# Patient Record
Sex: Female | Born: 1970 | Race: Black or African American | Hispanic: No | Marital: Single | Smoking: Never smoker
Health system: Southern US, Community
[De-identification: ages and names within clinical notes are randomized; demographics above are authoritative.]

## PROBLEM LIST (undated history)

## (undated) DIAGNOSIS — D649 Anemia, unspecified: Secondary | ICD-10-CM

## (undated) DIAGNOSIS — J45909 Unspecified asthma, uncomplicated: Secondary | ICD-10-CM

## (undated) DIAGNOSIS — M549 Dorsalgia, unspecified: Secondary | ICD-10-CM

## (undated) HISTORY — PX: FOOT SURGERY: SHX648

---

## 1997-11-12 ENCOUNTER — Emergency Department (HOSPITAL_COMMUNITY): Admission: EM | Admit: 1997-11-12 | Discharge: 1997-11-12 | Payer: Self-pay | Admitting: Emergency Medicine

## 1997-11-15 ENCOUNTER — Ambulatory Visit (HOSPITAL_COMMUNITY): Admission: RE | Admit: 1997-11-15 | Discharge: 1997-11-15 | Payer: Self-pay

## 1997-11-16 ENCOUNTER — Emergency Department (HOSPITAL_COMMUNITY): Admission: EM | Admit: 1997-11-16 | Discharge: 1997-11-16 | Payer: Self-pay | Admitting: Emergency Medicine

## 1997-12-06 ENCOUNTER — Other Ambulatory Visit: Admission: RE | Admit: 1997-12-06 | Discharge: 1997-12-06 | Payer: Self-pay | Admitting: Obstetrics

## 1998-02-20 ENCOUNTER — Emergency Department (HOSPITAL_COMMUNITY): Admission: EM | Admit: 1998-02-20 | Discharge: 1998-02-20 | Payer: Self-pay | Admitting: Emergency Medicine

## 1998-03-17 ENCOUNTER — Ambulatory Visit (HOSPITAL_COMMUNITY): Admission: RE | Admit: 1998-03-17 | Discharge: 1998-03-17 | Payer: Self-pay | Admitting: *Deleted

## 1998-03-17 ENCOUNTER — Encounter: Payer: Self-pay | Admitting: *Deleted

## 1998-03-31 ENCOUNTER — Ambulatory Visit (HOSPITAL_COMMUNITY): Admission: RE | Admit: 1998-03-31 | Discharge: 1998-03-31 | Payer: Self-pay | Admitting: *Deleted

## 1998-05-12 ENCOUNTER — Emergency Department (HOSPITAL_COMMUNITY): Admission: EM | Admit: 1998-05-12 | Discharge: 1998-05-12 | Payer: Self-pay | Admitting: Emergency Medicine

## 1998-07-06 ENCOUNTER — Encounter: Admission: RE | Admit: 1998-07-06 | Discharge: 1998-07-06 | Payer: Self-pay | Admitting: Family Medicine

## 1998-07-24 ENCOUNTER — Emergency Department (HOSPITAL_COMMUNITY): Admission: EM | Admit: 1998-07-24 | Discharge: 1998-07-24 | Payer: Self-pay | Admitting: Emergency Medicine

## 1998-10-26 ENCOUNTER — Encounter: Admission: RE | Admit: 1998-10-26 | Discharge: 1998-10-26 | Payer: Self-pay | Admitting: Family Medicine

## 1998-10-31 ENCOUNTER — Encounter: Admission: RE | Admit: 1998-10-31 | Discharge: 1998-12-09 | Payer: Self-pay | Admitting: *Deleted

## 1999-01-25 ENCOUNTER — Emergency Department (HOSPITAL_COMMUNITY): Admission: EM | Admit: 1999-01-25 | Discharge: 1999-01-25 | Payer: Self-pay | Admitting: Emergency Medicine

## 1999-02-03 ENCOUNTER — Encounter: Admission: RE | Admit: 1999-02-03 | Discharge: 1999-02-03 | Payer: Self-pay | Admitting: Family Medicine

## 2003-01-11 ENCOUNTER — Emergency Department (HOSPITAL_COMMUNITY): Admission: EM | Admit: 2003-01-11 | Discharge: 2003-01-11 | Payer: Self-pay | Admitting: Emergency Medicine

## 2003-01-20 ENCOUNTER — Emergency Department (HOSPITAL_COMMUNITY): Admission: EM | Admit: 2003-01-20 | Discharge: 2003-01-20 | Payer: Self-pay | Admitting: Emergency Medicine

## 2003-05-15 ENCOUNTER — Emergency Department (HOSPITAL_COMMUNITY): Admission: EM | Admit: 2003-05-15 | Discharge: 2003-05-15 | Payer: Self-pay | Admitting: Emergency Medicine

## 2005-01-09 ENCOUNTER — Ambulatory Visit (HOSPITAL_COMMUNITY): Payer: Self-pay | Admitting: Psychiatry

## 2005-06-19 ENCOUNTER — Emergency Department (HOSPITAL_COMMUNITY): Admission: EM | Admit: 2005-06-19 | Discharge: 2005-06-19 | Payer: Self-pay | Admitting: Family Medicine

## 2005-07-17 ENCOUNTER — Emergency Department (HOSPITAL_COMMUNITY): Admission: EM | Admit: 2005-07-17 | Discharge: 2005-07-17 | Payer: Self-pay | Admitting: Emergency Medicine

## 2005-10-24 ENCOUNTER — Encounter: Payer: Self-pay | Admitting: *Deleted

## 2006-05-02 ENCOUNTER — Emergency Department (HOSPITAL_COMMUNITY): Admission: EM | Admit: 2006-05-02 | Discharge: 2006-05-02 | Payer: Self-pay | Admitting: Family Medicine

## 2006-12-02 ENCOUNTER — Emergency Department (HOSPITAL_COMMUNITY): Admission: EM | Admit: 2006-12-02 | Discharge: 2006-12-02 | Payer: Self-pay | Admitting: Emergency Medicine

## 2008-08-04 ENCOUNTER — Emergency Department (HOSPITAL_COMMUNITY): Admission: EM | Admit: 2008-08-04 | Discharge: 2008-08-04 | Payer: Self-pay | Admitting: Emergency Medicine

## 2008-08-31 ENCOUNTER — Emergency Department (HOSPITAL_COMMUNITY): Admission: EM | Admit: 2008-08-31 | Discharge: 2008-08-31 | Payer: Self-pay | Admitting: Emergency Medicine

## 2009-07-10 ENCOUNTER — Emergency Department (HOSPITAL_COMMUNITY): Admission: EM | Admit: 2009-07-10 | Discharge: 2009-07-10 | Payer: Self-pay | Admitting: Emergency Medicine

## 2010-02-15 IMAGING — CR DG ABDOMEN ACUTE W/ 1V CHEST
3 series · 3 of 3 positions shown · non-contrast
Comparison: Chest and acute abdomen of 12/02/2006

CLINICAL DATA: Short of breath, abdominal pain, nausea

ACUTE ABDOMEN SERIES (ABDOMEN 2 VIEW & CHEST 1 VIEW)

[w chest pa]
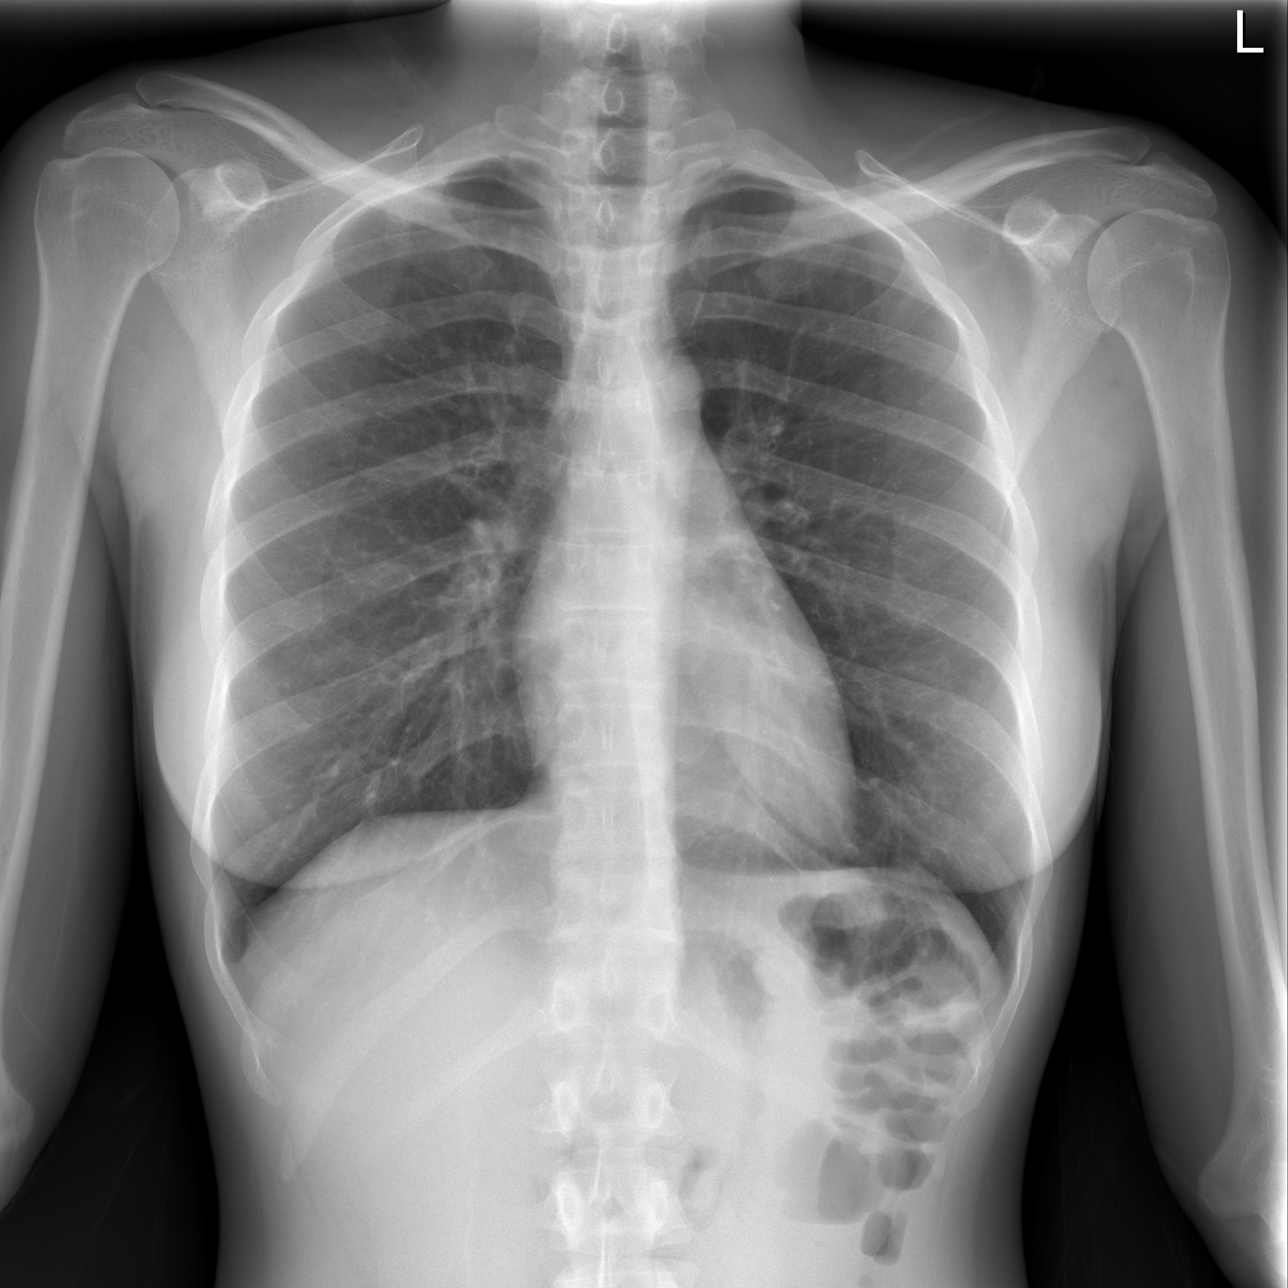

[w abdomen upright *]
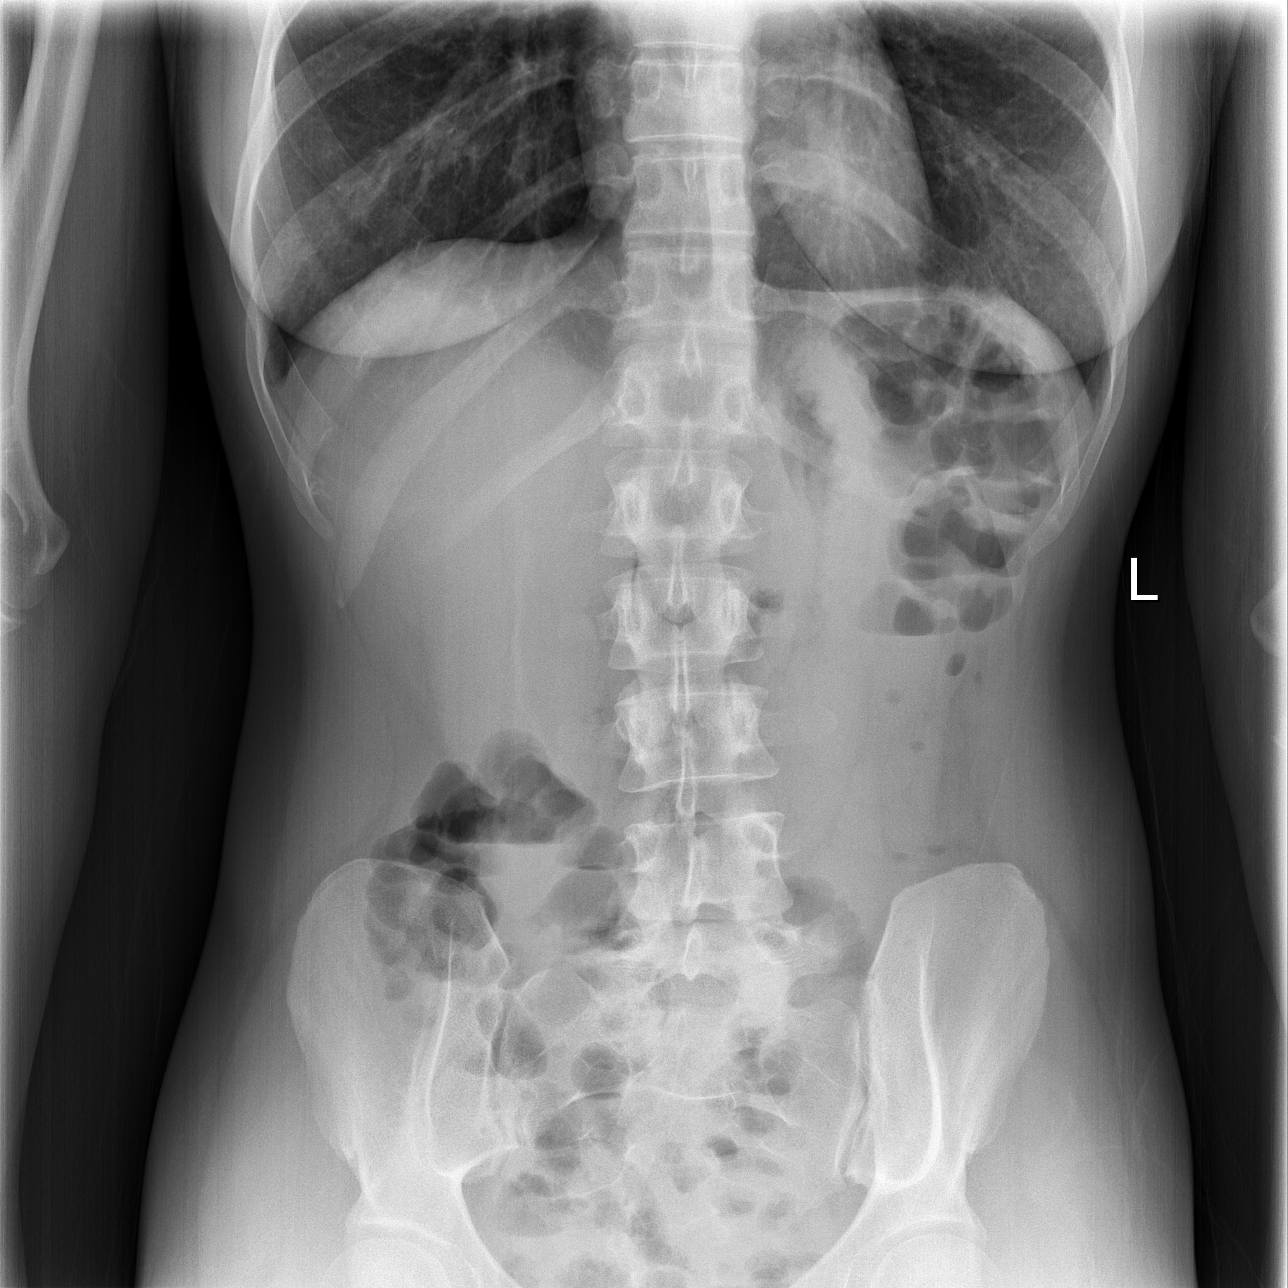

[t abdomen supine]
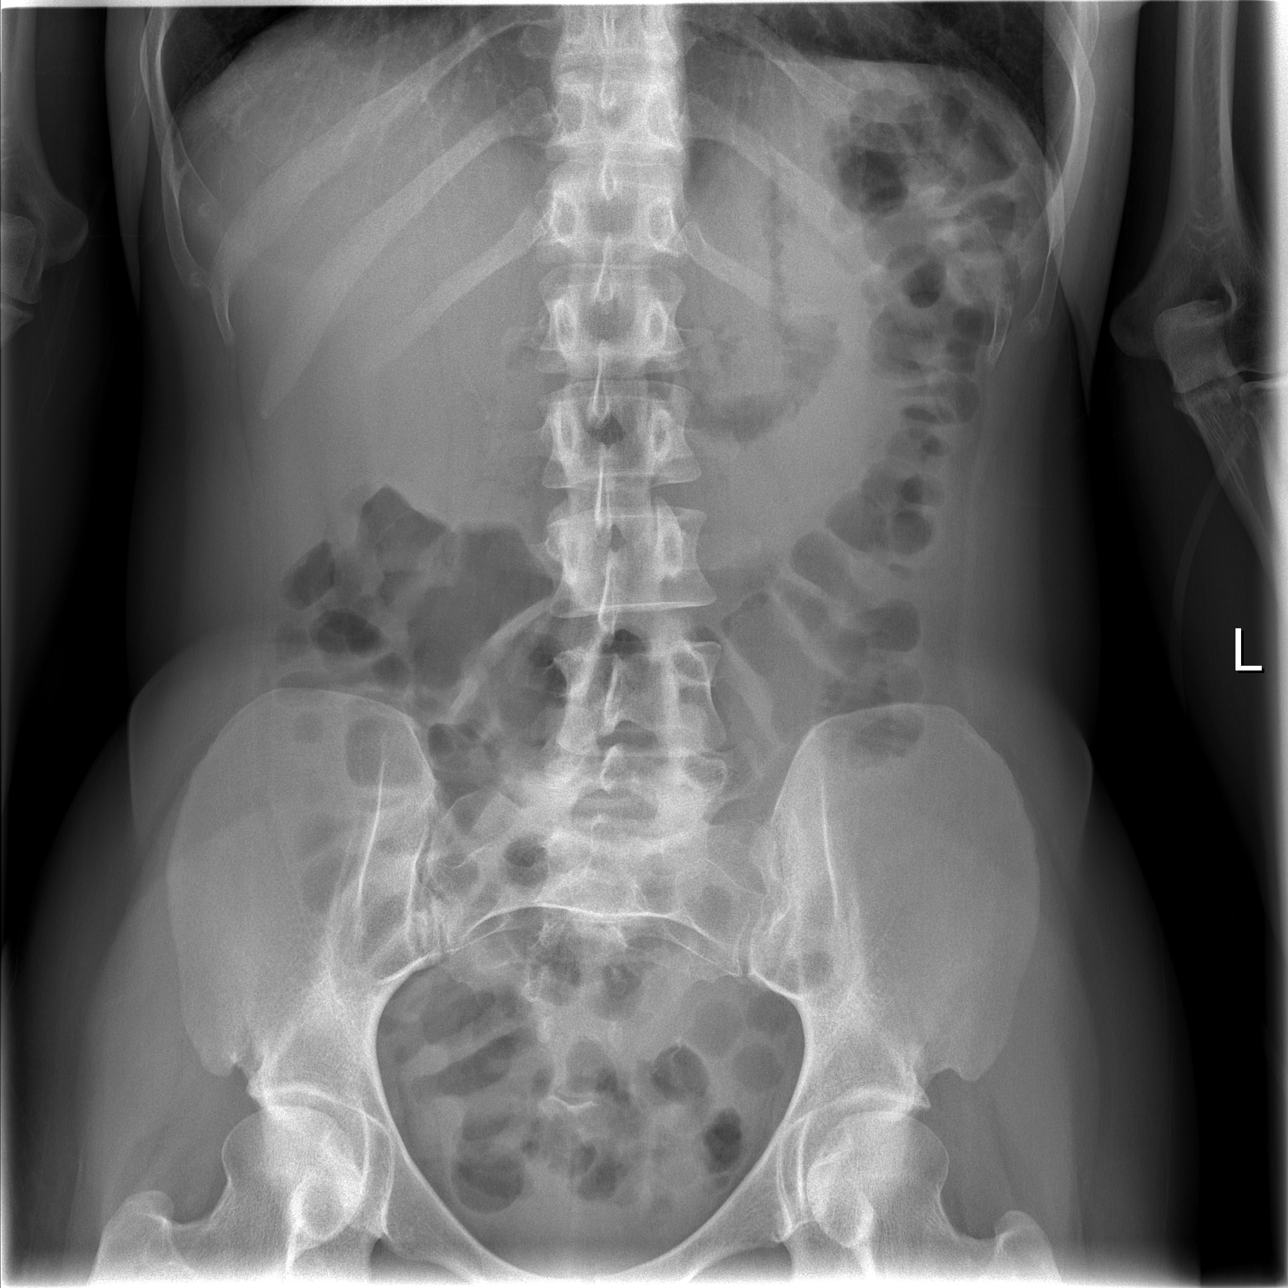

[3 of 3 positions shown; findings below may reference images not displayed]

FINDINGS: The lungs are clear. The heart is within normal limits in
size. No bony abnormality is seen.

Supine and erect views of the abdomen show no bowel obstruction and
no free air.  No opaque calculi are seen.  On the supine film there
is some separation of the gastric air shadow and the transverse
colon, a finding of questionable significance.  This can be seen
with ascites or possibly pancreatitis.  Clinical correlation is
recommended.
IMPRESSION: 1.  No active lung disease.
2.  No obstruction or free air.
3.  Some separation of the gastric air shadow and the transverse
colon on the supine film of questionable significance.  Cannot
exclude ascites or possibly pancreatitis as noted above.  Correlate
clinically.

## 2010-10-05 LAB — PREGNANCY, URINE: Preg Test, Ur: NEGATIVE

## 2010-10-05 LAB — DIFFERENTIAL
Eosinophils Absolute: 0.1 10*3/uL (ref 0.0–0.7)
Lymphocytes Relative: 13 % (ref 12–46)
Lymphs Abs: 0.9 10*3/uL (ref 0.7–4.0)
Neutro Abs: 5.3 10*3/uL (ref 1.7–7.7)
Neutrophils Relative %: 81 % — ABNORMAL HIGH (ref 43–77)

## 2010-10-05 LAB — URINALYSIS, ROUTINE W REFLEX MICROSCOPIC
Bilirubin Urine: NEGATIVE
Glucose, UA: NEGATIVE mg/dL
Hgb urine dipstick: NEGATIVE
Ketones, ur: NEGATIVE mg/dL
Nitrite: NEGATIVE
Protein, ur: NEGATIVE mg/dL
Specific Gravity, Urine: 1.024 (ref 1.005–1.030)
Urobilinogen, UA: 1 mg/dL (ref 0.0–1.0)
pH: 5.5 (ref 5.0–8.0)

## 2010-10-05 LAB — COMPREHENSIVE METABOLIC PANEL
ALT: 11 U/L (ref 0–35)
BUN: 7 mg/dL (ref 6–23)
CO2: 21 mEq/L (ref 19–32)
Calcium: 9.3 mg/dL (ref 8.4–10.5)
Creatinine, Ser: 0.82 mg/dL (ref 0.4–1.2)
GFR calc non Af Amer: >60 mL/min (ref >60)
Glucose, Bld: 94 mg/dL (ref 70–99)
Total Protein: 8.2 g/dL (ref 6.0–8.3)

## 2010-10-05 LAB — CBC
HCT: 35.9 % — ABNORMAL LOW (ref 36.0–46.0)
Hemoglobin: 12.1 g/dL (ref 12.0–15.0)
MCHC: 33.8 g/dL (ref 30.0–36.0)
MCV: 90.2 fL (ref 78.0–100.0)
RBC: 3.98 MIL/uL (ref 3.87–5.11)
RDW: 16.1 % — ABNORMAL HIGH (ref 11.5–15.5)

## 2011-04-12 LAB — CBC
HCT: 35.1 — ABNORMAL LOW
Hemoglobin: 11.8 — ABNORMAL LOW
MCHC: 33.7
RDW: 15.1 — ABNORMAL HIGH

## 2011-04-12 LAB — COMPREHENSIVE METABOLIC PANEL
BUN: 21
Calcium: 9.9
Glucose, Bld: 104 — ABNORMAL HIGH
Total Protein: 8.1

## 2011-04-12 LAB — DIFFERENTIAL
Lymphocytes Relative: 26
Lymphs Abs: 1.3
Monocytes Relative: 5
Neutro Abs: 3.3
Neutrophils Relative %: 68

## 2011-04-12 LAB — URINE MICROSCOPIC-ADD ON

## 2011-04-12 LAB — URINALYSIS, ROUTINE W REFLEX MICROSCOPIC
Glucose, UA: NEGATIVE
Leukocytes, UA: NEGATIVE
Protein, ur: 100 — AB
Specific Gravity, Urine: 1.039 — ABNORMAL HIGH
Urobilinogen, UA: 0.2

## 2011-12-25 ENCOUNTER — Emergency Department (HOSPITAL_COMMUNITY)
Admission: EM | Admit: 2011-12-25 | Discharge: 2011-12-25 | Disposition: A | Discharge status: Home / Self Care | Payer: Self-pay | Attending: Emergency Medicine | Admitting: Emergency Medicine

## 2011-12-25 ENCOUNTER — Encounter (HOSPITAL_COMMUNITY): Payer: Self-pay | Admitting: *Deleted

## 2011-12-25 ENCOUNTER — Emergency Department (HOSPITAL_COMMUNITY): Payer: Self-pay

## 2011-12-25 DIAGNOSIS — R059 Cough, unspecified: Secondary | ICD-10-CM | POA: Insufficient documentation

## 2011-12-25 DIAGNOSIS — R05 Cough: Secondary | ICD-10-CM | POA: Insufficient documentation

## 2011-12-25 DIAGNOSIS — J45909 Unspecified asthma, uncomplicated: Secondary | ICD-10-CM | POA: Insufficient documentation

## 2011-12-25 DIAGNOSIS — F17200 Nicotine dependence, unspecified, uncomplicated: Secondary | ICD-10-CM | POA: Insufficient documentation

## 2011-12-25 DIAGNOSIS — Z79899 Other long term (current) drug therapy: Secondary | ICD-10-CM | POA: Insufficient documentation

## 2011-12-25 HISTORY — DX: Unspecified asthma, uncomplicated: J45.909

## 2011-12-25 MED ORDER — PREDNISONE 20 MG PO TABS: 60.0000 mg | ORAL_TABLET | Freq: Every day | ORAL | Status: DC

## 2011-12-25 MED ORDER — ALBUTEROL SULFATE (5 MG/ML) 0.5% IN NEBU
2.5000 mg | INHALATION_SOLUTION | RESPIRATORY_TRACT | Status: DC
  Administered 2011-12-25: 2.5 mg via RESPIRATORY_TRACT
  Filled 2011-12-25: qty 0.5

## 2011-12-25 MED ORDER — ALBUTEROL SULFATE HFA 108 (90 BASE) MCG/ACT IN AERS
2.0000 | INHALATION_SPRAY | RESPIRATORY_TRACT | Status: DC | PRN
  Administered 2011-12-25: 2 via RESPIRATORY_TRACT
  Filled 2011-12-25: qty 6.7

## 2011-12-25 MED ORDER — IPRATROPIUM BROMIDE 0.02 % IN SOLN
0.5000 mg | RESPIRATORY_TRACT | Status: DC
  Administered 2011-12-25: 0.5 mg via RESPIRATORY_TRACT
  Filled 2011-12-25: qty 2.5

## 2011-12-25 MED ORDER — ACETAMINOPHEN-CODEINE #3 300-30 MG PO TABS: 1.0000 | ORAL_TABLET | Freq: Four times a day (QID) | ORAL | Status: AC | PRN

## 2011-12-25 MED ORDER — PREDNISONE 20 MG PO TABS
60.0000 mg | ORAL_TABLET | Freq: Once | ORAL | Status: AC
  Administered 2011-12-25: 60 mg via ORAL
  Filled 2011-12-25: qty 3

## 2011-12-25 MED ORDER — ALBUTEROL SULFATE 2 MG PO TABS: 2.0000 mg | ORAL_TABLET | Freq: Three times a day (TID) | ORAL | Status: DC

## 2011-12-25 NOTE — ED Notes (Signed)
Pt states she has had a dry cough pt states she is unable to sleep at night for cough. Pt states she thinks her asthma flaring up. Pt states she has tried her inhalers but nothing is effective

## 2011-12-25 NOTE — ED Provider Notes (Signed)
History     CSN: [id number]  Arrival date & time 12/25/11  1056   First MD Initiated Contact with Patient 12/25/11 1131      Chief Complaint  Patient presents with  . Asthma  . Cough    (Consider location/radiation/quality/duration/timing/severity/associated sxs/prior treatment) HPI  H/o asthma pw cough, shortness of breath, wheezing. +Subj fever +Chills. C/O non productive cough. Denies rhinorrhea, nasal congestion. No sick contacts. Has been using albuterol 2 times per day. Has been compliant with advair. Denies h/o VTE in self or family. No recent hosp/surg/immob. No h/o cancer. Denies exogenous hormone use, no leg pain or swelling.  Her triggers include Rain/ weather. She is in everyday smoker  ED Notes, ED Provider Notes from 12/25/11 0000 to 12/25/11 11:26:57       Norina Buzzard, RN 12/25/2011 11:25      Pt states she has had a dry cough pt states she is unable to sleep at night for cough. Pt states she thinks her asthma flaring up. Pt states she has tried her inhalers but nothing is effective    Past Medical History  Diagnosis Date  . Asthma     No past surgical history on file.  No family history on file.  History  Substance Use Topics  . Smoking status: Current Everyday Smoker  . Smokeless tobacco: Not on file  . Alcohol Use: No    OB History    Grav Para Term Preterm Abortions TAB SAB Ect Mult Living                 Review of Systems  All other systems reviewed and are negative.  except as noted HPI  Allergies  Shellfish allergy  Home Medications   Current Outpatient Rx  Name Route Sig Dispense Refill  . ALBUTEROL SULFATE HFA 108 (90 BASE) MCG/ACT IN AERS Inhalation Inhale 2 puffs into the lungs every 6 (six) hours as needed. Wheezing and shortness of breath    . FLUTICASONE-SALMETEROL 250-50 MCG/DOSE IN AEPB Inhalation Inhale 1 puff into the lungs every 12 (twelve) hours.    . ACETAMINOPHEN-CODEINE #3 300-30 MG PO TABS Oral Take 1-2 tablets by  mouth every 6 (six) hours as needed for pain (cough). 15 tablet 0  . ALBUTEROL SULFATE 2 MG PO TABS Oral Take 1 tablet (2 mg total) by mouth 3 (three) times daily. 30 tablet 0  . PREDNISONE 20 MG PO TABS Oral Take 3 tablets (60 mg total) by mouth daily. 15 tablet 0    BP 109/71  Pulse 83  Temp 99 F (37.2 C)  Resp 20  Ht 5\' 5"  (1.651 m)  Wt 160 lb (72.576 kg)  BMI 26.63 kg/m2  SpO2 100%  LMP 12/11/2011  Physical Exam  Nursing note and vitals reviewed. Constitutional: She is oriented to person, place, and time. She appears well-developed.  HENT:  Head: Atraumatic.  Mouth/Throat: Oropharynx is clear and moist.  Eyes: Conjunctivae and EOM are normal. Pupils are equal, round, and reactive to light.  Neck: Normal range of motion. Neck supple.  Cardiovascular: Normal rate, regular rhythm, normal heart sounds and intact distal pulses.   Pulmonary/Chest: Effort normal. No respiratory distress. She has wheezes. She has no rales.       Diffuse expiratory wheeze, good air movement, no conversational dyspnea  Abdominal: Soft. She exhibits no distension. There is no tenderness. There is no rebound and no guarding.  Musculoskeletal: Normal range of motion.  Neurological: She is alert and oriented to  person, place, and time.  Skin: Skin is warm and dry. No rash noted.  Psychiatric: She has a normal mood and affect.    ED Course  Procedures (including critical care time)  Labs Reviewed - No data to display Dg Chest 2 View  12/25/2011  *RADIOLOGY REPORT*  Clinical Data: Asthma and cough.  CHEST - 2 VIEW  Comparison: Chest radiograph 07/10/2009  Findings: The heart, mediastinal, and hilar contours are normal. The trachea is midline.  The lungs are normally expanded and clear. No airspace disease, pneumothorax, or pneumomediastinum.  The bony thorax and visualized upper abdomen are unremarkable.  IMPRESSION: Stable exam.  No acute cardiopulmonary disease.  Original Report Authenticated By: Britta Mccreedy, M.D.   1. Asthma    MDM   History of asthma presents with acute asthma exacerbation. Chest x-ray without infiltrate. Patient is self pay and it is difficult for her to get her albuterol. She does not have a primary care doctor. I will prescribe albuterol tablets, inhaler, prednisone. Tylenol with Codeine to help with coughing. Given referrals for primary care doctor.       Forbes Cellar, MD 12/25/11 1316

## 2011-12-25 NOTE — ED Notes (Signed)
[id number]<AV> Expected date:<BR> Expected time:<BR> Means of arrival:<BR> Comments:<BR> clean

## 2013-06-10 IMAGING — CR DG CHEST 2V
2 series · 2 of 2 positions shown · non-contrast
Comparison: Chest radiograph 07/10/2009

CLINICAL DATA: Asthma and cough.

CHEST - 2 VIEW

[w chest pa]
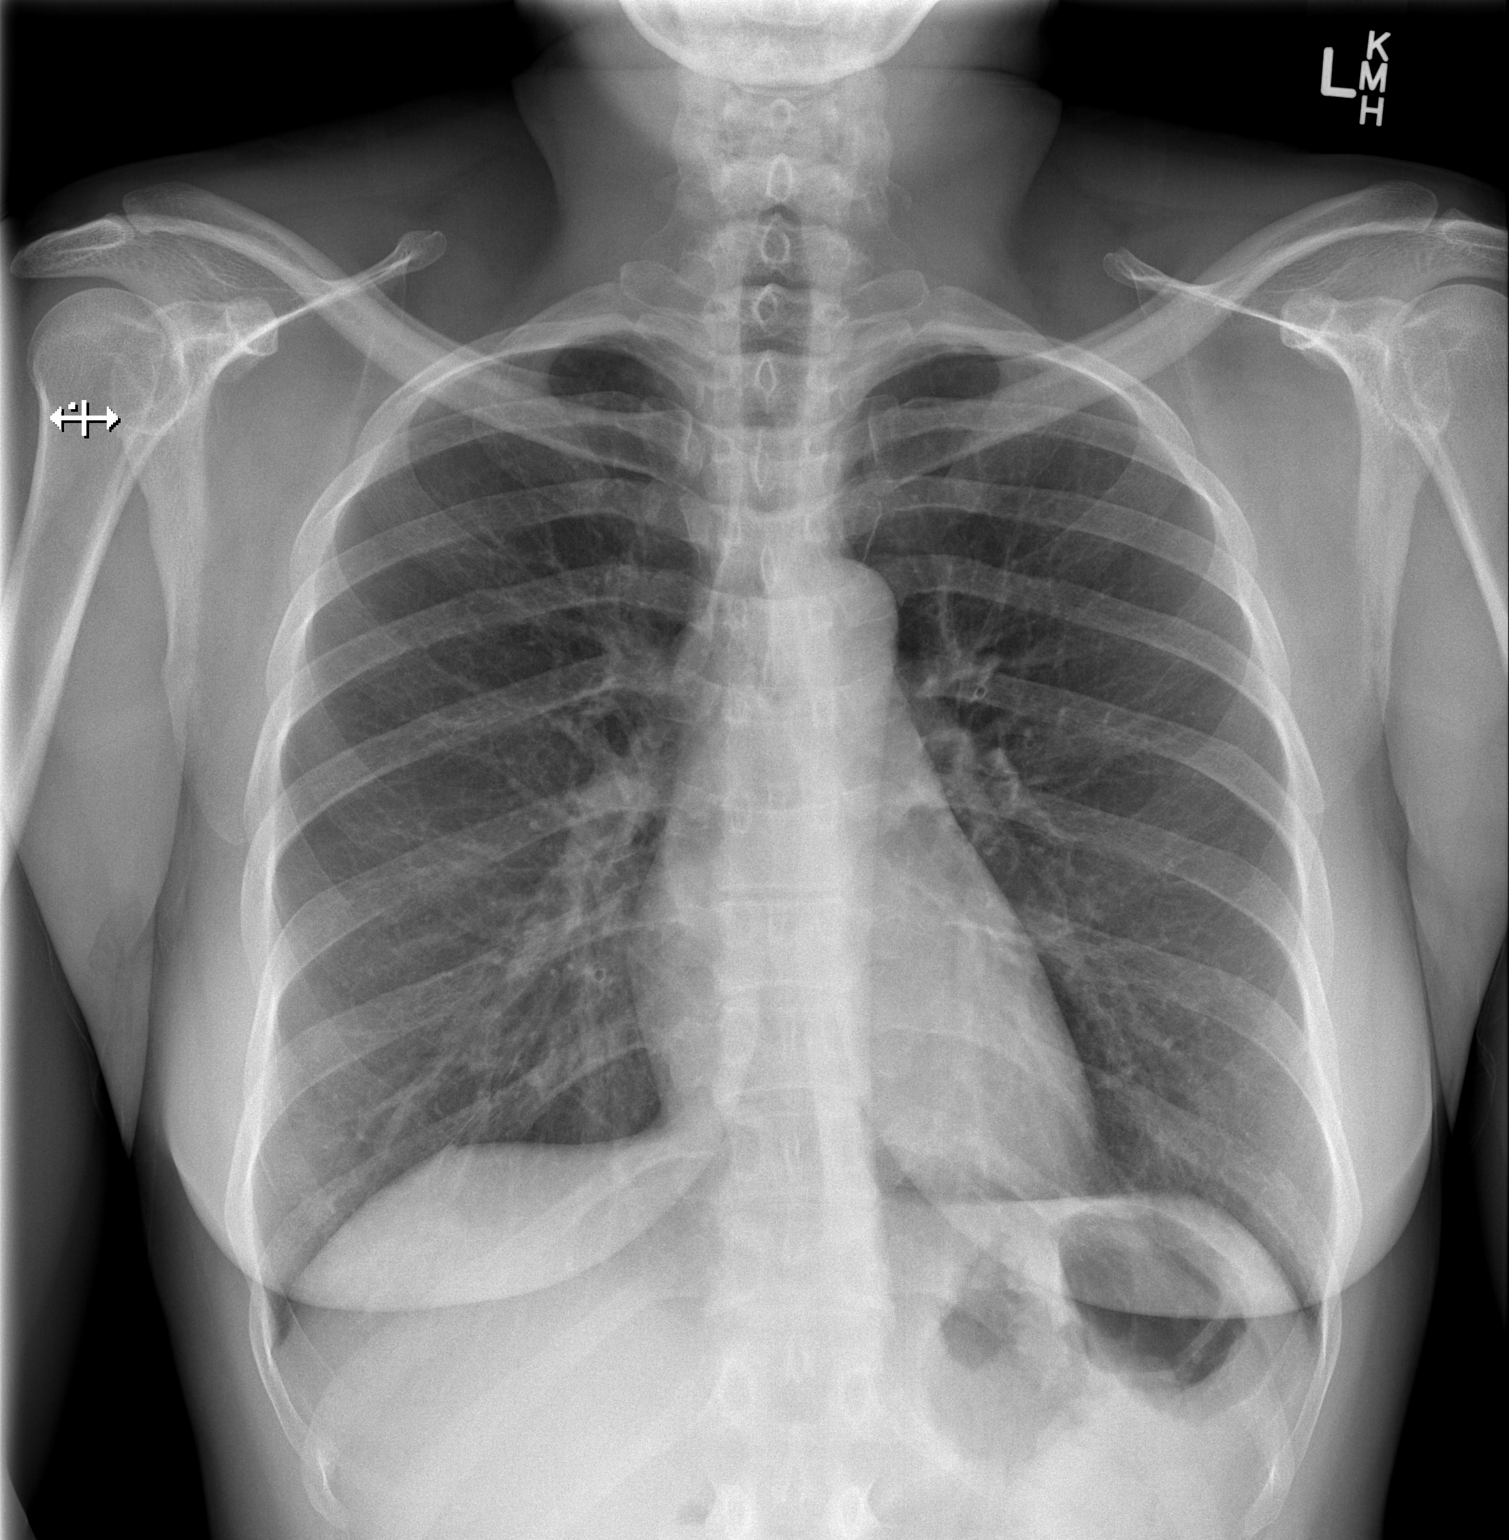

[w chest lat]
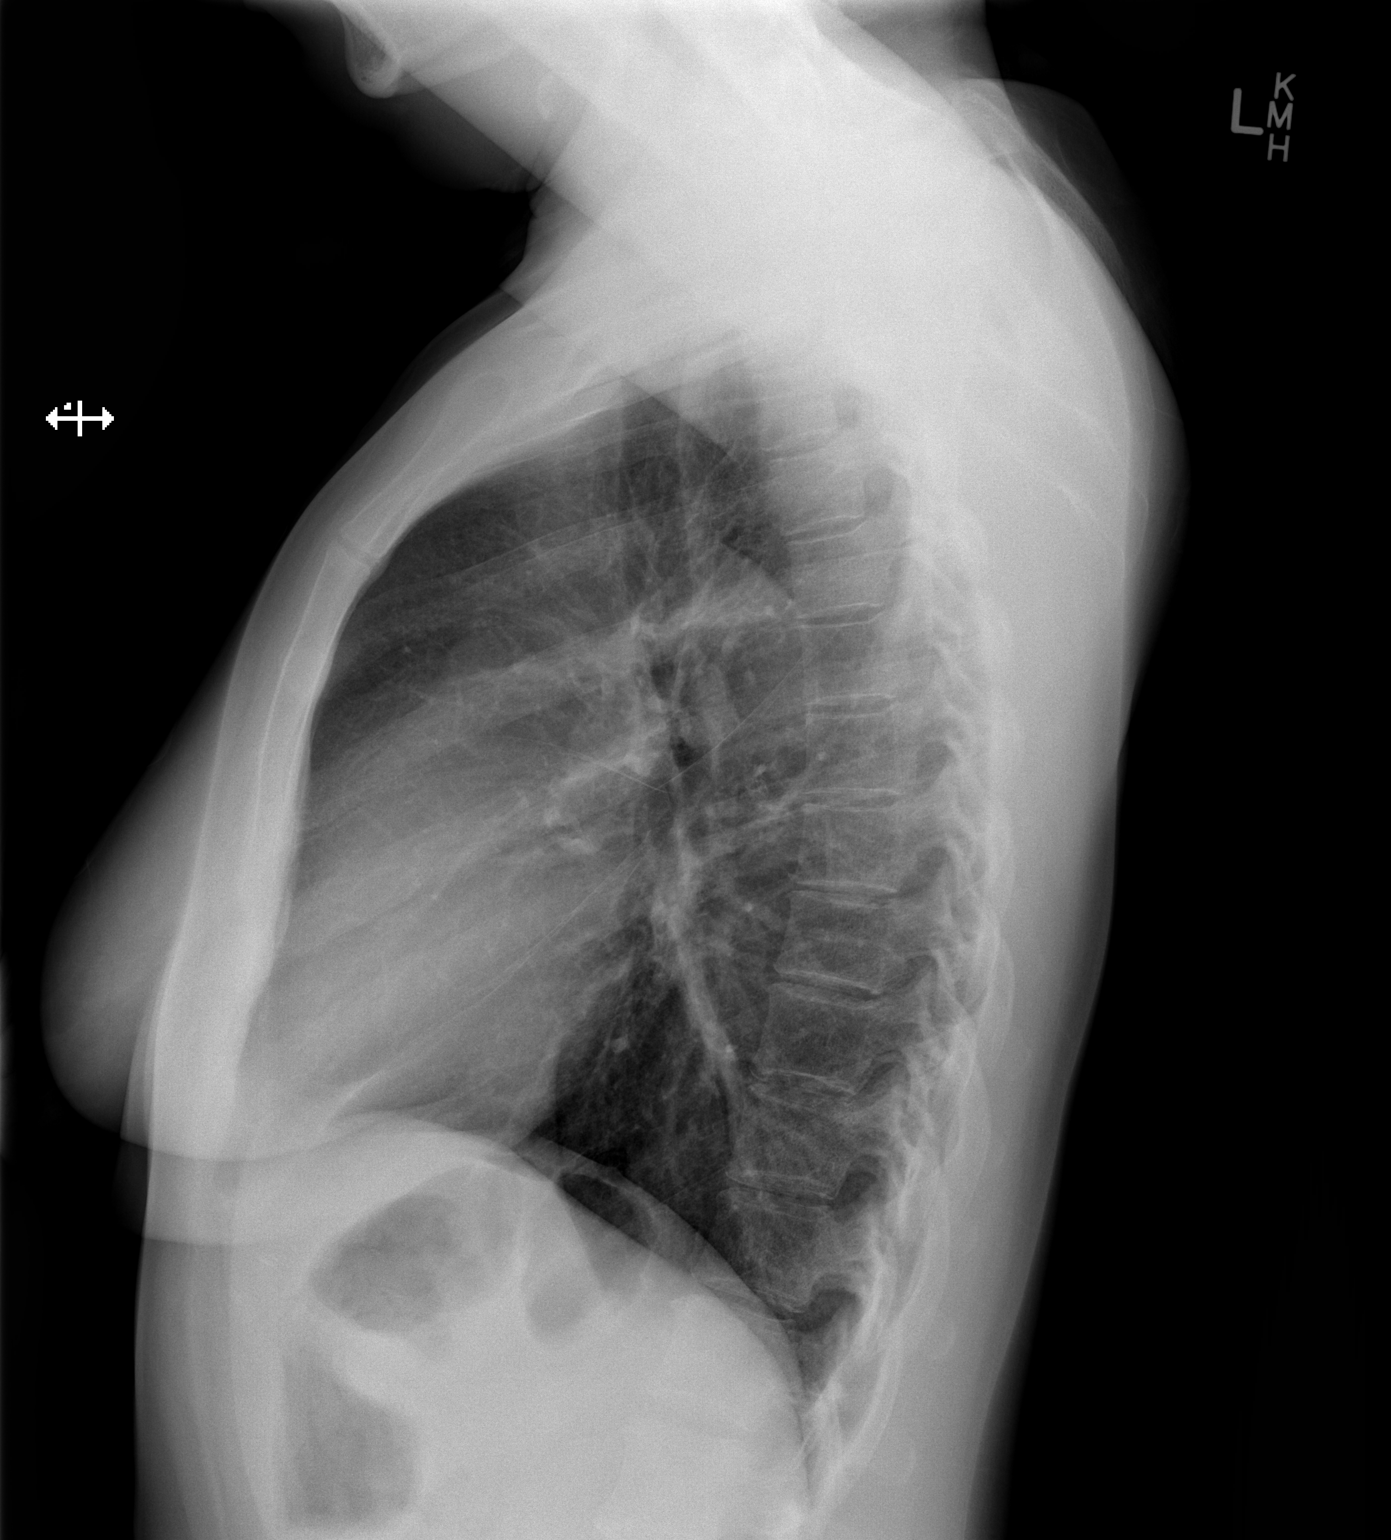

[2 of 2 positions shown; findings below may reference images not displayed]

FINDINGS: The heart, mediastinal, and hilar contours are normal.
The trachea is midline.  The lungs are normally expanded and clear.
No airspace disease, pneumothorax, or pneumomediastinum.  The bony
thorax and visualized upper abdomen are unremarkable.
IMPRESSION: Stable exam.  No acute cardiopulmonary disease.

## 2014-05-12 ENCOUNTER — Encounter (HOSPITAL_COMMUNITY): Payer: Self-pay | Admitting: Emergency Medicine

## 2014-05-12 DIAGNOSIS — Z720 Tobacco use: Secondary | ICD-10-CM | POA: Insufficient documentation

## 2014-05-12 DIAGNOSIS — A5901 Trichomonal vulvovaginitis: Secondary | ICD-10-CM | POA: Insufficient documentation

## 2014-05-12 DIAGNOSIS — R630 Anorexia: Secondary | ICD-10-CM | POA: Diagnosis not present

## 2014-05-12 DIAGNOSIS — D259 Leiomyoma of uterus, unspecified: Secondary | ICD-10-CM | POA: Insufficient documentation

## 2014-05-12 DIAGNOSIS — N8320 Unspecified ovarian cysts: Secondary | ICD-10-CM | POA: Insufficient documentation

## 2014-05-12 DIAGNOSIS — R1031 Right lower quadrant pain: Secondary | ICD-10-CM | POA: Diagnosis present

## 2014-05-12 DIAGNOSIS — Z7952 Long term (current) use of systemic steroids: Secondary | ICD-10-CM | POA: Insufficient documentation

## 2014-05-12 DIAGNOSIS — J45909 Unspecified asthma, uncomplicated: Secondary | ICD-10-CM | POA: Diagnosis not present

## 2014-05-12 DIAGNOSIS — Z79899 Other long term (current) drug therapy: Secondary | ICD-10-CM | POA: Diagnosis not present

## 2014-05-12 DIAGNOSIS — Z7951 Long term (current) use of inhaled steroids: Secondary | ICD-10-CM | POA: Insufficient documentation

## 2014-05-12 DIAGNOSIS — Z862 Personal history of diseases of the blood and blood-forming organs and certain disorders involving the immune mechanism: Secondary | ICD-10-CM | POA: Diagnosis not present

## 2014-05-12 DIAGNOSIS — Z3202 Encounter for pregnancy test, result negative: Secondary | ICD-10-CM | POA: Insufficient documentation

## 2014-05-12 LAB — CBC WITH DIFFERENTIAL/PLATELET
BASOS ABS: 0 10*3/uL (ref 0.0–0.1)
BASOS PCT: 0 % (ref 0–1)
EOS ABS: 0.1 10*3/uL (ref 0.0–0.7)
Eosinophils Relative: 2 % (ref 0–5)
HCT: 35 % — ABNORMAL LOW (ref 36.0–46.0)
Hemoglobin: 11.8 g/dL — ABNORMAL LOW (ref 12.0–15.0)
Lymphocytes Relative: 51 % — ABNORMAL HIGH (ref 12–46)
Lymphs Abs: 3.1 10*3/uL (ref 0.7–4.0)
MCH: 31.1 pg (ref 26.0–34.0)
MCHC: 33.7 g/dL (ref 30.0–36.0)
MCV: 92.1 fL (ref 78.0–100.0)
MONOS PCT: 5 % (ref 3–12)
Monocytes Absolute: 0.3 10*3/uL (ref 0.1–1.0)
NEUTROS ABS: 2.6 10*3/uL (ref 1.7–7.7)
NEUTROS PCT: 42 % — AB (ref 43–77)
Platelets: 185 10*3/uL (ref 150–400)
RBC: 3.8 MIL/uL — ABNORMAL LOW (ref 3.87–5.11)
RDW: 13.2 % (ref 11.5–15.5)
WBC: 6.2 10*3/uL (ref 4.0–10.5)

## 2014-05-12 NOTE — ED Notes (Signed)
Pt. reports RLQ pain with mild nausea onset today , denies fever or chills. No diarrhea.

## 2014-05-13 ENCOUNTER — Emergency Department (HOSPITAL_COMMUNITY): Payer: Medicaid - Out of State

## 2014-05-13 ENCOUNTER — Encounter (HOSPITAL_COMMUNITY): Payer: Self-pay

## 2014-05-13 ENCOUNTER — Emergency Department (HOSPITAL_COMMUNITY)
Admission: EM | Admit: 2014-05-13 | Discharge: 2014-05-13 | Disposition: A | Discharge status: Home / Self Care | Payer: Medicaid - Out of State | Attending: Emergency Medicine | Admitting: Emergency Medicine

## 2014-05-13 DIAGNOSIS — N83209 Unspecified ovarian cyst, unspecified side: Secondary | ICD-10-CM

## 2014-05-13 DIAGNOSIS — R52 Pain, unspecified: Secondary | ICD-10-CM

## 2014-05-13 DIAGNOSIS — R1031 Right lower quadrant pain: Secondary | ICD-10-CM

## 2014-05-13 DIAGNOSIS — A599 Trichomoniasis, unspecified: Secondary | ICD-10-CM

## 2014-05-13 DIAGNOSIS — D259 Leiomyoma of uterus, unspecified: Secondary | ICD-10-CM

## 2014-05-13 HISTORY — DX: Anemia, unspecified: D64.9

## 2014-05-13 LAB — COMPREHENSIVE METABOLIC PANEL
ALBUMIN: 3.7 g/dL (ref 3.5–5.2)
ALT: 7 U/L (ref 0–35)
ANION GAP: 12 (ref 5–15)
AST: 16 U/L (ref 0–37)
Alkaline Phosphatase: 54 U/L (ref 39–117)
BILIRUBIN TOTAL: 0.4 mg/dL (ref 0.3–1.2)
BUN: 5 mg/dL — AB (ref 6–23)
CHLORIDE: 102 meq/L (ref 96–112)
CO2: 23 mEq/L (ref 19–32)
CREATININE: 0.69 mg/dL (ref 0.50–1.10)
Calcium: 9.2 mg/dL (ref 8.4–10.5)
GFR calc Af Amer: >90 mL/min (ref >90)
GFR calc non Af Amer: >90 mL/min (ref >90)
Glucose, Bld: 99 mg/dL (ref 70–99)
POTASSIUM: 3.7 meq/L (ref 3.7–5.3)
Sodium: 137 mEq/L (ref 137–147)
TOTAL PROTEIN: 7.4 g/dL (ref 6.0–8.3)

## 2014-05-13 LAB — URINALYSIS, ROUTINE W REFLEX MICROSCOPIC
Bilirubin Urine: NEGATIVE
GLUCOSE, UA: NEGATIVE mg/dL
Hgb urine dipstick: NEGATIVE
Ketones, ur: NEGATIVE mg/dL
NITRITE: NEGATIVE
PH: 7 (ref 5.0–8.0)
Protein, ur: NEGATIVE mg/dL
SPECIFIC GRAVITY, URINE: 1.017 (ref 1.005–1.030)
Urobilinogen, UA: 2 mg/dL — ABNORMAL HIGH (ref 0.0–1.0)

## 2014-05-13 LAB — PREGNANCY, URINE: PREG TEST UR: NEGATIVE

## 2014-05-13 LAB — URINE MICROSCOPIC-ADD ON

## 2014-05-13 LAB — WET PREP, GENITAL: Yeast Wet Prep HPF POC: NONE SEEN

## 2014-05-13 LAB — RPR

## 2014-05-13 LAB — HIV ANTIBODY (ROUTINE TESTING W REFLEX): HIV 1&2 Ab, 4th Generation: NONREACTIVE

## 2014-05-13 MED ORDER — IOHEXOL 300 MG/ML  SOLN
25.0000 mL | INTRAMUSCULAR | Status: AC
  Administered 2014-05-13: 25 mL via ORAL

## 2014-05-13 MED ORDER — FENTANYL CITRATE 0.05 MG/ML IJ SOLN
100.0000 ug | Freq: Once | INTRAMUSCULAR | Status: DC
  Filled 2014-05-13: qty 2

## 2014-05-13 MED ORDER — OXYCODONE-ACETAMINOPHEN 5-325 MG PO TABS
2.0000 | ORAL_TABLET | Freq: Once | ORAL | Status: DC
  Filled 2014-05-13: qty 2

## 2014-05-13 MED ORDER — MORPHINE SULFATE 4 MG/ML IJ SOLN
4.0000 mg | Freq: Once | INTRAMUSCULAR | Status: AC
  Administered 2014-05-13: 4 mg via INTRAVENOUS
  Filled 2014-05-13: qty 1

## 2014-05-13 MED ORDER — DIPHENHYDRAMINE HCL 50 MG/ML IJ SOLN
25.0000 mg | Freq: Once | INTRAMUSCULAR | Status: AC
  Administered 2014-05-13: 25 mg via INTRAVENOUS
  Filled 2014-05-13: qty 1

## 2014-05-13 MED ORDER — KETOROLAC TROMETHAMINE 60 MG/2ML IM SOLN
60.0000 mg | Freq: Once | INTRAMUSCULAR | Status: AC
  Administered 2014-05-13: 60 mg via INTRAMUSCULAR
  Filled 2014-05-13: qty 2

## 2014-05-13 MED ORDER — ONDANSETRON HCL 4 MG/2ML IJ SOLN
4.0000 mg | Freq: Once | INTRAMUSCULAR | Status: AC
  Administered 2014-05-13: 4 mg via INTRAVENOUS
  Filled 2014-05-13: qty 2

## 2014-05-13 MED ORDER — METRONIDAZOLE 500 MG PO TABS: 500.0000 mg | ORAL_TABLET | Freq: Two times a day (BID) | ORAL | Status: DC

## 2014-05-13 MED ORDER — IOHEXOL 300 MG/ML  SOLN
100.0000 mL | Freq: Once | INTRAMUSCULAR | Status: AC | PRN
  Administered 2014-05-13: 100 mL via INTRAVENOUS

## 2014-05-13 MED ORDER — NAPROXEN 500 MG PO TABS: 500.0000 mg | ORAL_TABLET | Freq: Two times a day (BID) | ORAL | Status: DC

## 2014-05-13 NOTE — ED Provider Notes (Signed)
Patient reexamined, she is resting peacefully, she has complaints only of lower abdominal discomfort, her laboratory workup was discussed in detail with her including hemorrhagic cyst, need for repeat ultrasound in 6 weeks, Trichomonas. She will be given medications as below, she is stable for follow-up   Meds given in ED:  Medications  iohexol (OMNIPAQUE) 300 MG/ML solution 25 mL (25 mLs Oral Contrast Given 05/13/14 0330)  fentaNYL (SUBLIMAZE) injection 100 mcg (100 mcg Intravenous Not Given 05/13/14 0556)  oxyCODONE-acetaminophen (PERCOCET/ROXICET) 5-325 MG per tablet 2 tablet (2 tablets Oral Not Given 05/13/14 1031)  ketorolac (TORADOL) injection 60 mg (not administered)  morphine 4 MG/ML injection 4 mg (4 mg Intravenous Given 05/13/14 0139)  ondansetron (ZOFRAN) injection 4 mg (4 mg Intravenous Given 05/13/14 0139)  diphenhydrAMINE (BENADRYL) injection 25 mg (25 mg Intravenous Given 05/13/14 0144)  morphine 4 MG/ML injection 4 mg (4 mg Intravenous Given 05/13/14 0317)  diphenhydrAMINE (BENADRYL) injection 25 mg (25 mg Intravenous Given 05/13/14 0327)  iohexol (OMNIPAQUE) 300 MG/ML solution 100 mL (100 mLs Intravenous Contrast Given 05/13/14 0404)  morphine 4 MG/ML injection 4 mg (4 mg Intravenous Given 05/13/14 0615)  diphenhydrAMINE (BENADRYL) injection 25 mg (25 mg Intravenous Given 05/13/14 0615)    New Prescriptions   METRONIDAZOLE (FLAGYL) 500 MG TABLET    Take 1 tablet (500 mg total) by mouth 2 (two) times daily.   NAPROXEN (NAPROSYN) 500 MG TABLET    Take 1 tablet (500 mg total) by mouth 2 (two) times daily with a meal.      Johnna Acosta, MD 05/13/14 1038

## 2014-05-13 NOTE — ED Notes (Signed)
Called Gerald Stabs in CT to report patient has finished drinking contrast and has IV access. He acknowledges.

## 2014-05-13 NOTE — ED Provider Notes (Signed)
CSN: [id number]     Arrival date & time 05/12/14  2301 History   First MD Initiated Contact with Patient 05/13/14 0118     Chief Complaint  Patient presents with  . Abdominal Pain     (Consider location/radiation/quality/duration/timing/severity/associated sxs/prior Treatment) HPI Complains of right lower quadrant pain, nonradiating onset 9 AM yesterday morning pain is burning in nature made worse with moving improved with remaining still. No vaginal discharge. Last bowel movement 12 noon yesterday, normal other associated symptoms include anorexia. Denies fever. No treatment prior to coming here. Past Medical History  Diagnosis Date  . Asthma   . Anemia    Past Surgical History  Procedure Laterality Date  . Cesarean section    . Foot surgery     No family history on file. History  Substance Use Topics  . Smoking status: Current Every Day Smoker  . Smokeless tobacco: Not on file  . Alcohol Use: No  positive marijuana use occasional alcohol use OB History    No data available     Review of Systems  Constitutional: Positive for appetite change.  HENT: Negative.   Respiratory: Negative.   Cardiovascular: Negative.   Gastrointestinal: Positive for nausea and abdominal pain.  Genitourinary:       Amenorrheic, has IUD  Musculoskeletal: Negative.   Skin: Negative.   Neurological: Negative.   Psychiatric/Behavioral: Negative.   All other systems reviewed and are negative.     Allergies  Shellfish allergy  Home Medications   Prior to Admission medications   Medication Sig Start Date End Date Taking? Authorizing Provider  albuterol (PROVENTIL HFA;VENTOLIN HFA) 108 (90 BASE) MCG/ACT inhaler Inhale 2 puffs into the lungs every 6 (six) hours as needed. Wheezing and shortness of breath    Historical Provider, MD  albuterol (PROVENTIL) 2 MG tablet Take 1 tablet (2 mg total) by mouth 3 (three) times daily. 12/25/11 12/24/12  Blair Heys, MD  Fluticasone-Salmeterol (ADVAIR)  250-50 MCG/DOSE AEPB Inhale 1 puff into the lungs every 12 (twelve) hours.    Historical Provider, MD  predniSONE (DELTASONE) 20 MG tablet Take 3 tablets (60 mg total) by mouth daily. 12/25/11   Blair Heys, MD   BP 126/88 mmHg  Pulse 90  Temp(Src) 98.2 F (36.8 C) (Oral)  Resp 16  Wt 161 lb 1 oz (73.057 kg)  SpO2 96% Physical Exam  Constitutional: She appears well-developed and well-nourished.  HENT:  Head: Normocephalic and atraumatic.  Eyes: Conjunctivae are normal. Pupils are equal, round, and reactive to light.  Neck: Neck supple. No tracheal deviation present. No thyromegaly present.  Cardiovascular: Normal rate and regular rhythm.   No murmur heard. Pulmonary/Chest: Effort normal and breath sounds normal.  Abdominal: Soft. Bowel sounds are normal. She exhibits no distension. There is tenderness.  Tender at right lower quadrant  Genitourinary:  No external lesion.yellowish vaginal discharge. No cervical motion tenderness no adnexal masses or tenderness  Musculoskeletal: Normal range of motion. She exhibits no edema or tenderness.  Neurological: She is alert. Coordination normal.  Skin: Skin is warm and dry. No rash noted.  Psychiatric: She has a normal mood and affect.  Nursing note and vitals reviewed.   ED Course  Procedures (including critical care time) Labs Review Labs Reviewed  CBC WITH DIFFERENTIAL - Abnormal; Notable for the following:    RBC 3.80 (*)    Hemoglobin 11.8 (*)    HCT 35.0 (*)    Neutrophils Relative % 42 (*)    Lymphocytes Relative 51 (*)  All other components within normal limits  COMPREHENSIVE METABOLIC PANEL - Abnormal; Notable for the following:    BUN 5 (*)    All other components within normal limits  PREGNANCY, URINE  URINALYSIS, ROUTINE W REFLEX MICROSCOPIC    Imaging Review No results found.   EKG Interpretation None     7:45 AM pain improved after treatment with intravenous opioids Results for orders placed or performed  during the hospital encounter of 05/13/14  Wet prep, genital  Result Value Ref Range   Yeast Wet Prep HPF POC NONE SEEN NONE SEEN   Trich, Wet Prep MANY (A) NONE SEEN   Clue Cells Wet Prep HPF POC MODERATE (A) NONE SEEN   WBC, Wet Prep HPF POC FEW (A) NONE SEEN  CBC with Differential  Result Value Ref Range   WBC 6.2 4.0 - 10.5 K/uL   RBC 3.80 (L) 3.87 - 5.11 MIL/uL   Hemoglobin 11.8 (L) 12.0 - 15.0 g/dL   HCT 35.0 (L) 36.0 - 46.0 %   MCV 92.1 78.0 - 100.0 fL   MCH 31.1 26.0 - 34.0 pg   MCHC 33.7 30.0 - 36.0 g/dL   RDW 13.2 11.5 - 15.5 %   Platelets 185 150 - 400 K/uL   Neutrophils Relative % 42 (L) 43 - 77 %   Neutro Abs 2.6 1.7 - 7.7 K/uL   Lymphocytes Relative 51 (H) 12 - 46 %   Lymphs Abs 3.1 0.7 - 4.0 K/uL   Monocytes Relative 5 3 - 12 %   Monocytes Absolute 0.3 0.1 - 1.0 K/uL   Eosinophils Relative 2 0 - 5 %   Eosinophils Absolute 0.1 0.0 - 0.7 K/uL   Basophils Relative 0 0 - 1 %   Basophils Absolute 0.0 0.0 - 0.1 K/uL  Comprehensive metabolic panel  Result Value Ref Range   Sodium 137 137 - 147 mEq/L   Potassium 3.7 3.7 - 5.3 mEq/L   Chloride 102 96 - 112 mEq/L   CO2 23 19 - 32 mEq/L   Glucose, Bld 99 70 - 99 mg/dL   BUN 5 (L) 6 - 23 mg/dL   Creatinine, Ser 0.69 0.50 - 1.10 mg/dL   Calcium 9.2 8.4 - 10.5 mg/dL   Total Protein 7.4 6.0 - 8.3 g/dL   Albumin 3.7 3.5 - 5.2 g/dL   AST 16 0 - 37 U/L   ALT 7 0 - 35 U/L   Alkaline Phosphatase 54 39 - 117 U/L   Total Bilirubin 0.4 0.3 - 1.2 mg/dL   GFR calc non Af Amer >90 >90 mL/min   GFR calc Af Amer >90 >90 mL/min   Anion gap 12 5 - 15  Urinalysis, Routine w reflex microscopic  Result Value Ref Range   Color, Urine AMBER (A) YELLOW   APPearance CLOUDY (A) CLEAR   Specific Gravity, Urine 1.017 1.005 - 1.030   pH 7.0 5.0 - 8.0   Glucose, UA NEGATIVE NEGATIVE mg/dL   Hgb urine dipstick NEGATIVE NEGATIVE   Bilirubin Urine NEGATIVE NEGATIVE   Ketones, ur NEGATIVE NEGATIVE mg/dL   Protein, ur NEGATIVE NEGATIVE mg/dL    Urobilinogen, UA 2.0 (H) 0.0 - 1.0 mg/dL   Nitrite NEGATIVE NEGATIVE   Leukocytes, UA MODERATE (A) NEGATIVE  Urine microscopic-add on  Result Value Ref Range   Squamous Epithelial / LPF MANY (A) RARE   WBC, UA 11-20 <3 WBC/hpf   RBC / HPF 0-2 <3 RBC/hpf   Bacteria, UA FEW (A) RARE  Urine-Other TRICHOMONAS PRESENT   Pregnancy, urine  Result Value Ref Range   Preg Test, Ur NEGATIVE NEGATIVE   Ct Abdomen Pelvis W Contrast  05/13/2014   CLINICAL DATA:  Right lower quadrant pain since yesterday.  Nausea.  EXAM: CT ABDOMEN AND PELVIS WITH CONTRAST  TECHNIQUE: Multidetector CT imaging of the abdomen and pelvis was performed using the standard protocol following bolus administration of intravenous contrast.  CONTRAST:  157mL OMNIPAQUE IOHEXOL 300 MG/ML  SOLN  COMPARISON:  None.  FINDINGS: Mild dependent changes in the lung bases. Small amount of residual contrast material in the lower esophagus may indicate reflux or dysmotility.  The liver, spleen, gallbladder, pancreas, adrenal glands, kidneys, abdominal aorta, inferior vena cava, and retroperitoneal lymph nodes are unremarkable. Stomach and small bowel appear normal. Colon is mostly decompressed with scattered stool present. Scattered colonic diverticula. Possible wall thickening in the transverse and descending colon although this segment is under distended and appearance may be normal variation versus colitis. No free air or free fluid in the abdomen.  Pelvis: Intrauterine device is present. Uterus is retroflexed and appears mildly enlarged and heterogeneous. Myometrial calcification. Changes suggest fibroids. Cyst in the right ovary with fluid fluid levels suggesting hemorrhagic cyst. No free or loculated pelvic fluid collections. Appendix is normal. No pelvic mass or lymphadenopathy. Bladder wall is mildly thickened which could indicate cystitis. Mild degenerative changes in the lumbar spine. No destructive bone lesions appreciated.  IMPRESSION:  Inflammatory wall thickening versus underdistention in the transverse and descending colon. Hemorrhagic cyst in the right ovary. Intrauterine device present. Uterine fibroids. Appendix is normal. Mild wall thickening in the bladder may indicate cystitis.   Electronically Signed   By: Lucienne Capers M.D.   On: 05/13/2014 04:26    MDM  We'll treat empirically STDs. CT scan discussed with radiologist. Feel that pelvic ultrasound would best diagnose pelvic pathology such as tubo-ovarian abscess . Pt signed out to Dr. Sabra Heck at 750 a Final diagnoses:  None   Dx#1 pelvic inflammatory disease #2 trichomoniasis     Orlie Dakin, MD 05/13/14 [phone]

## 2014-05-13 NOTE — Discharge Instructions (Signed)
Please call your doctor for a followup appointment within 24-48 hours. When you talk to your doctor please let them know that you were seen in the emergency department and have them acquire all of your records so that they can discuss the findings with you and formulate a treatment plan to fully care for your new and ongoing problems. ° °

## 2014-05-13 NOTE — ED Notes (Signed)
Pt reports RLQ that began yesterday morning approx 9am - progressively worse - admits to nausea, denies vomiting or diarrhea, no recent fever. Denies vaginal discharge or bleeding.

## 2014-05-13 NOTE — ED Notes (Signed)
Pt awaiting U/S. Up trying to move around in the room. Family member asleep on the stretcher.

## 2014-05-13 NOTE — ED Notes (Signed)
Pt resting; appears to be asleep

## 2014-05-13 NOTE — ED Notes (Signed)
Patient transported to Ultrasound 

## 2014-05-14 LAB — GC/CHLAMYDIA PROBE AMP
CT Probe RNA: NEGATIVE
GC PROBE AMP APTIMA: NEGATIVE

## 2015-04-21 ENCOUNTER — Emergency Department (HOSPITAL_COMMUNITY)
Admission: EM | Admit: 2015-04-21 | Discharge: 2015-04-22 | Disposition: A | Discharge status: Home / Self Care | Payer: Medicaid - Out of State | Attending: Physician Assistant | Admitting: Physician Assistant

## 2015-04-21 ENCOUNTER — Encounter (HOSPITAL_COMMUNITY): Payer: Self-pay | Admitting: *Deleted

## 2015-04-21 DIAGNOSIS — K279 Peptic ulcer, site unspecified, unspecified as acute or chronic, without hemorrhage or perforation: Secondary | ICD-10-CM | POA: Diagnosis not present

## 2015-04-21 DIAGNOSIS — R112 Nausea with vomiting, unspecified: Secondary | ICD-10-CM | POA: Diagnosis present

## 2015-04-21 DIAGNOSIS — J45909 Unspecified asthma, uncomplicated: Secondary | ICD-10-CM | POA: Diagnosis not present

## 2015-04-21 DIAGNOSIS — Z3202 Encounter for pregnancy test, result negative: Secondary | ICD-10-CM | POA: Insufficient documentation

## 2015-04-21 DIAGNOSIS — R509 Fever, unspecified: Secondary | ICD-10-CM | POA: Diagnosis not present

## 2015-04-21 LAB — COMPREHENSIVE METABOLIC PANEL
ALBUMIN: 4.7 g/dL (ref 3.5–5.0)
ALK PHOS: 68 U/L (ref 38–126)
ALT: 10 U/L — ABNORMAL LOW (ref 14–54)
ANION GAP: 11 (ref 5–15)
AST: 20 U/L (ref 15–41)
BUN: 12 mg/dL (ref 6–20)
CALCIUM: 9.9 mg/dL (ref 8.9–10.3)
CO2: 21 mmol/L — AB (ref 22–32)
Chloride: 107 mmol/L (ref 101–111)
Creatinine, Ser: 0.66 mg/dL (ref 0.44–1.00)
GFR calc Af Amer: >60 mL/min (ref >60)
GFR calc non Af Amer: >60 mL/min (ref >60)
GLUCOSE: 156 mg/dL — AB (ref 65–99)
POTASSIUM: 3.7 mmol/L (ref 3.5–5.1)
SODIUM: 139 mmol/L (ref 135–145)
Total Bilirubin: 0.5 mg/dL (ref 0.3–1.2)
Total Protein: 8.8 g/dL — ABNORMAL HIGH (ref 6.5–8.1)

## 2015-04-21 LAB — CBC
HCT: 38.5 % (ref 36.0–46.0)
HEMOGLOBIN: 13.1 g/dL (ref 12.0–15.0)
MCH: 32 pg (ref 26.0–34.0)
MCHC: 34 g/dL (ref 30.0–36.0)
MCV: 94.1 fL (ref 78.0–100.0)
Platelets: 252 10*3/uL (ref 150–400)
RBC: 4.09 MIL/uL (ref 3.87–5.11)
RDW: 13.6 % (ref 11.5–15.5)
WBC: 5.7 10*3/uL (ref 4.0–10.5)

## 2015-04-21 LAB — LIPASE, BLOOD: Lipase: 20 U/L (ref 11–51)

## 2015-04-21 MED ORDER — SODIUM CHLORIDE 0.9 % IV BOLUS (SEPSIS)
1000.0000 mL | Freq: Once | INTRAVENOUS | Status: AC
  Administered 2015-04-21: 1000 mL via INTRAVENOUS

## 2015-04-21 MED ORDER — ONDANSETRON 4 MG PO TBDP
4.0000 mg | ORAL_TABLET | Freq: Once | ORAL | Status: AC | PRN
  Administered 2015-04-21: 4 mg via ORAL
  Filled 2015-04-21: qty 1

## 2015-04-21 MED ORDER — GI COCKTAIL ~~LOC~~
30.0000 mL | Freq: Once | ORAL | Status: DC
  Filled 2015-04-21: qty 30

## 2015-04-21 MED ORDER — PANTOPRAZOLE SODIUM 40 MG PO TBEC: 40.0000 mg | DELAYED_RELEASE_TABLET | Freq: Every day | ORAL | Status: DC

## 2015-04-21 NOTE — ED Provider Notes (Signed)
CSN: [id number]     Arrival date & time 04/21/15  2158 History  By signing my name below, I, Vickie Hernandez, attest that this documentation has been prepared under the direction and in the presence of Olando Willems Julio Alm, MD . Electronically Signed: Evelene Hernandez, Scribe. 04/22/2015. 12:45 AM.  Chief Complaint  Patient presents with  . Fever  . Chills  . Emesis   The history is provided by the patient. No language interpreter was used.   HPI Comments:  Vickie Hernandez is a 44 y.o. female who presents to the Emergency Department complaining of constant nausea and multiple episodes of vomiting since 2pm. She reports associated abdominal pain and chills. Pt denies fever. No alleviating factors noted; no treatments tried. She reports a h/o same with 2 hospitalizations last month; states she was  diagnosed with severe acid reflux and H. Pylori.  Past Medical History  Diagnosis Date  . Asthma    History reviewed. No pertinent past surgical history. No family history on file. Social History  Substance Use Topics  . Smoking status: Never Smoker   . Smokeless tobacco: None  . Alcohol Use: No   OB History    No data available     Review of Systems  Constitutional: Positive for chills. Negative for fever.  Gastrointestinal: Positive for nausea and vomiting. Negative for diarrhea.  All other systems reviewed and are negative.  Allergies  Review of patient's allergies indicates no known allergies.  Home Medications   Prior to Admission medications   Medication Sig Start Date End Date Taking? Authorizing Provider  carisoprodol (SOMA) 350 MG tablet Take 350 mg by mouth 4 (four) times daily as needed for muscle spasms.   Yes Historical Provider, MD  oxyCODONE-acetaminophen (PERCOCET) 10-325 MG tablet Take 1 tablet by mouth every 4 (four) hours as needed for pain.   Yes Historical Provider, MD   BP 131/50 mmHg  Pulse 83  Temp(Src) 98.3 F (36.8 C) (Oral)  Resp 18  SpO2  100% Physical Exam  Constitutional: She is oriented to person, place, and time. She appears well-developed and well-nourished. No distress.  HENT:  Head: Normocephalic and atraumatic.  Eyes: Conjunctivae are normal.  Cardiovascular: Normal rate, regular rhythm and normal heart sounds.   Pulmonary/Chest: Effort normal and breath sounds normal. No respiratory distress.  Abdominal: Soft. She exhibits no distension. There is tenderness (mild epigastric ).  Neurological: She is alert and oriented to person, place, and time.  Skin: Skin is warm and dry.  Psychiatric: She has a normal mood and affect.  Nursing note and vitals reviewed.   ED Course  Procedures   DIAGNOSTIC STUDIES:  Oxygen Saturation is 100% on RA, normal by my interpretation.    COORDINATION OF CARE:  11:06 PM Will order labs, fluids, nausea meds and omeprazole. Discussed treatment plan with pt at bedside and pt agreed to plan.  Labs Review Labs Reviewed  COMPREHENSIVE METABOLIC PANEL - Abnormal; Notable for the following:    CO2 21 (*)    Glucose, Bld 156 (*)    Total Protein 8.8 (*)    ALT 10 (*)    All other components within normal limits  LIPASE, BLOOD  CBC  URINALYSIS, ROUTINE W REFLEX MICROSCOPIC (NOT AT [facility])  PREGNANCY, URINE    Imaging Review No results found. I have personally reviewed and evaluated these lab results as part of my medical decision-making.   EKG Interpretation None      MDM   Final diagnoses:  None    Patient is a 15 her old female presenting today with epigastric pain and vomiting. Patient has history of this. She has been to the ER twice in the last couple months for similar symptoms. She was diagnosed with peptic ulcer disease versus gastritis. Patient recently had her physician call her while she is visiting here from [place]. Her physician told her that she has H pylori. Patient has not been taking any medications for this. Patient had onset of nausea and vomiting and  pain at 2 PM today. Patient is well hydrated on exam. Her vital signs are normal. She is minimal epigastric tenderness.  We will try to treat her symptomatically. Patient is on narcotics at home for pain. We will not be increasing in any of her narcotic use. We will giver her home dose here.  I personally performed the services described in this documentation, which was scribed in my presence. The recorded information has been reviewed and is accurate.   3:33 AM Patient took PO. Normal vitals and physical exam still.   Sleeeping comfortably.  Will discharge home. Pt requesting perscription she can afford.  Will given them off the 4 dolalr list.      Vickie Hernandez Julio Alm, MD 04/22/15 [phone]

## 2015-04-21 NOTE — ED Notes (Signed)
Pt states she is unable to void at this time.

## 2015-04-21 NOTE — ED Notes (Signed)
Pt unable to swallow GI cocktail at this time due to nausea.

## 2015-04-21 NOTE — ED Notes (Signed)
Patient arrives by EMS from a "friend's house" with complaints of abdominal pain, fever, coughing and retching.  States fever started at 1400, patient is from [place], and has H. pylori

## 2015-04-21 NOTE — ED Notes (Signed)
Bed: YK59 Expected date:  Expected time:  Means of arrival:  Comments: EMS 44 yo female with shaking chills

## 2015-04-22 ENCOUNTER — Encounter (HOSPITAL_COMMUNITY): Payer: Self-pay

## 2015-04-22 LAB — HCG, QUANTITATIVE, PREGNANCY

## 2015-04-22 MED ORDER — OMEPRAZOLE 20 MG PO CPDR: 20.0000 mg | DELAYED_RELEASE_CAPSULE | Freq: Every day | ORAL | Status: DC

## 2015-04-22 MED ORDER — PROMETHAZINE HCL 25 MG PO TABS: 25.0000 mg | ORAL_TABLET | Freq: Four times a day (QID) | ORAL | Status: DC | PRN

## 2015-04-22 MED ORDER — FAMOTIDINE 20 MG PO TABS
20.0000 mg | ORAL_TABLET | Freq: Once | ORAL | Status: DC
  Filled 2015-04-22: qty 1

## 2015-04-22 MED ORDER — PROMETHAZINE HCL 25 MG/ML IJ SOLN
12.5000 mg | Freq: Once | INTRAMUSCULAR | Status: AC
  Administered 2015-04-22: 12.5 mg via INTRAVENOUS
  Filled 2015-04-22: qty 1

## 2015-04-22 NOTE — ED Notes (Signed)
Pt has not attempted to eat crackers provided but states she has had sips of Sprite.

## 2015-04-22 NOTE — ED Notes (Signed)
Pt provided with crackers and sprite. Pt she can not void at this time and does not want to attempt to void.

## 2015-04-22 NOTE — Discharge Instructions (Signed)
Please follow up with her regular physicians in [place] regarding your abdominal pain. Please use the medications as prescribed to help with your symptoms.   Food Choices for Peptic Ulcer Disease When you have peptic ulcer disease, the foods you eat and your eating habits are very important. Choosing the right foods can help ease the discomfort of peptic ulcer disease. WHAT GENERAL GUIDELINES DO I NEED TO FOLLOW?  Choose fruits, vegetables, whole grains, and low-fat meat, fish, and poultry.   Keep a food diary to identify foods that cause symptoms.  Avoid foods that cause irritation or pain. These may be different for different people.  Eat frequent small meals instead of three large meals each day. The pain may be worse when your stomach is empty.  Avoid eating close to bedtime. WHAT FOODS ARE NOT RECOMMENDED? The following are some foods and drinks that may worsen your symptoms:  Black, white, and red pepper.  Hot sauce.  Chili peppers.  Chili powder.  Chocolate and cocoa.   Alcohol.  Tea, coffee, and cola (regular and decaffeinated). The items listed above may not be a complete list of foods and beverages to avoid. Contact your dietitian for more information.   This information is not intended to replace advice given to you by your health care provider. Make sure you discuss any questions you have with your health care provider.   Document Released: 09/03/2011 Document Revised: 06/16/2013 Document Reviewed: 04/15/2013 Elsevier Interactive Patient Education [employer].

## 2015-04-23 ENCOUNTER — Emergency Department (HOSPITAL_COMMUNITY)
Admission: EM | Admit: 2015-04-23 | Discharge: 2015-04-23 | Disposition: A | Discharge status: Home / Self Care | Payer: Medicaid - Out of State | Attending: Emergency Medicine | Admitting: Emergency Medicine

## 2015-04-23 ENCOUNTER — Encounter (HOSPITAL_COMMUNITY): Payer: Self-pay | Admitting: Emergency Medicine

## 2015-04-23 DIAGNOSIS — R231 Pallor: Secondary | ICD-10-CM | POA: Insufficient documentation

## 2015-04-23 DIAGNOSIS — Z862 Personal history of diseases of the blood and blood-forming organs and certain disorders involving the immune mechanism: Secondary | ICD-10-CM | POA: Diagnosis not present

## 2015-04-23 DIAGNOSIS — Z79899 Other long term (current) drug therapy: Secondary | ICD-10-CM | POA: Diagnosis not present

## 2015-04-23 DIAGNOSIS — Z791 Long term (current) use of non-steroidal anti-inflammatories (NSAID): Secondary | ICD-10-CM | POA: Diagnosis not present

## 2015-04-23 DIAGNOSIS — G8929 Other chronic pain: Secondary | ICD-10-CM | POA: Diagnosis not present

## 2015-04-23 DIAGNOSIS — Z7952 Long term (current) use of systemic steroids: Secondary | ICD-10-CM | POA: Insufficient documentation

## 2015-04-23 DIAGNOSIS — Z792 Long term (current) use of antibiotics: Secondary | ICD-10-CM | POA: Insufficient documentation

## 2015-04-23 DIAGNOSIS — R112 Nausea with vomiting, unspecified: Secondary | ICD-10-CM | POA: Diagnosis not present

## 2015-04-23 DIAGNOSIS — R197 Diarrhea, unspecified: Secondary | ICD-10-CM | POA: Diagnosis not present

## 2015-04-23 DIAGNOSIS — Z9889 Other specified postprocedural states: Secondary | ICD-10-CM | POA: Insufficient documentation

## 2015-04-23 DIAGNOSIS — Z8619 Personal history of other infectious and parasitic diseases: Secondary | ICD-10-CM | POA: Diagnosis not present

## 2015-04-23 DIAGNOSIS — Z8711 Personal history of peptic ulcer disease: Secondary | ICD-10-CM | POA: Diagnosis not present

## 2015-04-23 DIAGNOSIS — J45909 Unspecified asthma, uncomplicated: Secondary | ICD-10-CM | POA: Insufficient documentation

## 2015-04-23 DIAGNOSIS — R1013 Epigastric pain: Secondary | ICD-10-CM | POA: Diagnosis not present

## 2015-04-23 LAB — CBC WITH DIFFERENTIAL/PLATELET
BASOS ABS: 0 10*3/uL (ref 0.0–0.1)
Basophils Relative: 0 %
EOS PCT: 0 %
Eosinophils Absolute: 0 10*3/uL (ref 0.0–0.7)
HCT: 37 % (ref 36.0–46.0)
Hemoglobin: 12.4 g/dL (ref 12.0–15.0)
LYMPHS ABS: 1.9 10*3/uL (ref 0.7–4.0)
LYMPHS PCT: 46 %
MCH: 31.5 pg (ref 26.0–34.0)
MCHC: 33.5 g/dL (ref 30.0–36.0)
MCV: 93.9 fL (ref 78.0–100.0)
MONO ABS: 0.2 10*3/uL (ref 0.1–1.0)
Monocytes Relative: 5 %
Neutro Abs: 2 10*3/uL (ref 1.7–7.7)
Neutrophils Relative %: 49 %
PLATELETS: 162 10*3/uL (ref 150–400)
RBC: 3.94 MIL/uL (ref 3.87–5.11)
RDW: 13.6 % (ref 11.5–15.5)
WBC: 4.1 10*3/uL (ref 4.0–10.5)

## 2015-04-23 LAB — COMPREHENSIVE METABOLIC PANEL
ALT: 11 U/L — ABNORMAL LOW (ref 14–54)
ANION GAP: 15 (ref 5–15)
AST: 20 U/L (ref 15–41)
Albumin: 4 g/dL (ref 3.5–5.0)
Alkaline Phosphatase: 54 U/L (ref 38–126)
BUN: 14 mg/dL (ref 6–20)
CHLORIDE: 106 mmol/L (ref 101–111)
CO2: 18 mmol/L — ABNORMAL LOW (ref 22–32)
Calcium: 9.4 mg/dL (ref 8.9–10.3)
Creatinine, Ser: 0.66 mg/dL (ref 0.44–1.00)
Glucose, Bld: 100 mg/dL — ABNORMAL HIGH (ref 65–99)
POTASSIUM: 3.8 mmol/L (ref 3.5–5.1)
Sodium: 139 mmol/L (ref 135–145)
Total Bilirubin: 0.7 mg/dL (ref 0.3–1.2)
Total Protein: 8 g/dL (ref 6.5–8.1)

## 2015-04-23 LAB — LIPASE, BLOOD: LIPASE: 17 U/L (ref 11–51)

## 2015-04-23 MED ORDER — PANTOPRAZOLE SODIUM 40 MG IV SOLR
40.0000 mg | Freq: Once | INTRAVENOUS | Status: AC
  Administered 2015-04-23: 40 mg via INTRAVENOUS
  Filled 2015-04-23: qty 40

## 2015-04-23 MED ORDER — HYDROMORPHONE HCL 1 MG/ML IJ SOLN
1.0000 mg | Freq: Once | INTRAMUSCULAR | Status: AC
  Administered 2015-04-23: 1 mg via INTRAVENOUS
  Filled 2015-04-23: qty 1

## 2015-04-23 MED ORDER — SODIUM CHLORIDE 0.9 % IV BOLUS (SEPSIS)
1000.0000 mL | Freq: Once | INTRAVENOUS | Status: AC
  Administered 2015-04-23: 1000 mL via INTRAVENOUS

## 2015-04-23 MED ORDER — HYDROXYZINE HCL 25 MG PO TABS
25.0000 mg | ORAL_TABLET | Freq: Once | ORAL | Status: AC
  Administered 2015-04-23: 25 mg via ORAL
  Filled 2015-04-23: qty 1

## 2015-04-23 MED ORDER — GI COCKTAIL ~~LOC~~
30.0000 mL | Freq: Once | ORAL | Status: AC
  Administered 2015-04-23: 30 mL via ORAL
  Filled 2015-04-23: qty 30

## 2015-04-23 MED ORDER — ONDANSETRON HCL 4 MG/2ML IJ SOLN
4.0000 mg | Freq: Once | INTRAMUSCULAR | Status: AC
  Administered 2015-04-23: 4 mg via INTRAVENOUS
  Filled 2015-04-23: qty 2

## 2015-04-23 NOTE — Discharge Instructions (Signed)
Please follow-up with your PCP and GI doctor next week as scheduled.  As we discussed, fill and take the omeprazole and zofran prescriptions that Vickie Hernandez gave you the other day. You may also take liquid Benadryl which will help with your abdominal pain.     Please obtain all of your results from medical records or have your doctors office obtain the results - share them with your doctor - you should be seen at your doctors office in the next 2 days. Call today to arrange your follow up. Take the medications as prescribed. Please review all of the medicines and only take them if you do not have an allergy to them. Please be aware that if you are taking birth control pills, taking other prescriptions, ESPECIALLY ANTIBIOTICS may make the birth control ineffective - if this is the case, either do not engage in sexual activity or use alternative methods of birth control such as condoms until you have finished the medicine and your family doctor says it is OK to restart them. If you are on a blood thinner such as COUMADIN, be aware that any other medicine that you take may cause the coumadin to either work too much, or not enough - you should have your coumadin level rechecked in next 7 days if this is the case.  ?  It is also a possibility that you have an allergic reaction to any of the medicines that you have been prescribed - Everybody reacts differently to medications and while MOST people have no trouble with most medicines, you may have a reaction such as nausea, vomiting, rash, swelling, shortness of breath. If this is the case, please stop taking the medicine immediately and contact your physician.  ?  You should return to the ER if you develop severe or worsening symptoms.

## 2015-04-23 NOTE — ED Provider Notes (Signed)
CSN: [id number]     Arrival date & time 04/23/15  1005 History   First MD Initiated Contact with Patient 04/23/15 1020     Chief Complaint  Patient presents with  . Emesis  . Diarrhea    HPI  Vickie Hernandez is a 44 y.o. female with PMH including H. Pylori/PUD, chronic back pain, and asthma who presents to the ED today for evaluation of abdominal pain and N/V/D.  She states that she has chronic sharp, constant epigastric pain. The pain is reportedly associated with N/V and diarrhea. She does have a history of chronic abdominal pain and PUD and states this feels the same as all of her previous flares. She reports she cannot tolerate anything PO except small sips of water. Reports "countless" episodes of emesis per day. Denies blood in her emesis. Endorses 4-5 non-bloody episodes of diarrhea a day. She states she has had subjective fever for the past few days but has been unable to check her temperature. She was seen at [facility] two days ago for same complaints. States that "nothing they did worked" and she has continued to have extreme pain and N/V/D without relief. At that visit she was given rx for PO zofran and omeprazole which she has not filled yet. She states that the zofran ODT given at [facility] didn't help her nausea. Pt reports she has percocet at home for her chronic back pain but that has not helped her abdominal pain at all. She denies chest pain, SOB, urinary problems, lightheadedness/dizziness. Pt states that she is visiting from [place] and has PCP and GI f/u when she returns home early next week. Pt reports she received a phone call from her doctor a few days ago telling her she is H. Pylori positive and they will start treatment when she returns home. She has reportedly been hospitalized twice in the past month in [place] for these symptoms.   Past Medical History  Diagnosis Date  . Anemia   . Asthma    Past Surgical History  Procedure Laterality Date  . Cesarean section     . Foot surgery     History reviewed. No pertinent family history. Social History  Substance Use Topics  . Smoking status: Never Smoker   . Smokeless tobacco: None  . Alcohol Use: No   OB History    Gravida Para Term Preterm AB TAB SAB Ectopic Multiple Living   0 0 0 0 0 0 0 0       Review of Systems  All other systems reviewed and are negative.     Allergies  Shellfish allergy  Home Medications   Prior to Admission medications   Medication Sig Start Date End Date Taking? Authorizing Provider  albuterol (PROVENTIL HFA;VENTOLIN HFA) 108 (90 BASE) MCG/ACT inhaler Inhale 2 puffs into the lungs every 6 (six) hours as needed. Wheezing and shortness of breath    Historical Provider, MD  albuterol (PROVENTIL) 2 MG tablet Take 1 tablet (2 mg total) by mouth 3 (three) times daily. 12/25/11 12/24/12  Blair Heys, MD  amitriptyline (ELAVIL) 25 MG tablet Take 25 mg by mouth 3 (three) times daily as needed (for anxiety, usually takes twice daily).     Historical Provider, MD  carisoprodol (SOMA) 350 MG tablet Take 350 mg by mouth 4 (four) times daily as needed for muscle spasms.    Historical Provider, MD  DULoxetine (CYMBALTA) 30 MG capsule Take 30 mg by mouth daily.    Historical Provider, MD  DULoxetine (CYMBALTA) 60 MG capsule Take 60 mg by mouth daily as needed (may take for elevated anxiety).     Historical Provider, MD  Fluticasone-Salmeterol (ADVAIR) 250-50 MCG/DOSE AEPB Inhale 1 puff into the lungs every 12 (twelve) hours.    Historical Provider, MD  gabapentin (NEURONTIN) 300 MG capsule Take 300 mg by mouth 3 (three) times daily.    Historical Provider, MD  metroNIDAZOLE (FLAGYL) 500 MG tablet Take 1 tablet (500 mg total) by mouth 2 (two) times daily. 05/13/14   Noemi Chapel, MD  montelukast (SINGULAIR) 10 MG tablet Take 10 mg by mouth at bedtime.    Historical Provider, MD  naproxen (NAPROSYN) 500 MG tablet Take 1 tablet (500 mg total) by mouth 2 (two) times daily with a meal.  05/13/14   Noemi Chapel, MD  omeprazole (PRILOSEC) 20 MG capsule Take 1 capsule (20 mg total) by mouth daily. 04/22/15   Courteney Lyn Mackuen, MD  oxyCODONE-acetaminophen (PERCOCET) 10-325 MG tablet Take 1 tablet by mouth every 4 (four) hours as needed for pain.    Historical Provider, MD  oxyCODONE-acetaminophen (PERCOCET/ROXICET) 5-325 MG per tablet Take by mouth every 4 (four) hours as needed for severe pain.    Historical Provider, MD  predniSONE (DELTASONE) 20 MG tablet Take 3 tablets (60 mg total) by mouth daily. 12/25/11   Blair Heys, MD  promethazine (PHENERGAN) 25 MG tablet Take 1 tablet (25 mg total) by mouth every 6 (six) hours as needed for nausea or vomiting. 04/22/15   Courteney Lyn Mackuen, MD  tiZANidine (ZANAFLEX) 4 MG tablet Take 4 mg by mouth at bedtime.    Historical Provider, MD   BP 119/71 mmHg  Pulse 63  Temp(Src) 98.9 F (37.2 C) (Oral)  Resp 16  Ht 5\' 5"  (1.651 m)  Wt 155 lb (70.308 kg)  BMI 25.79 kg/m2  SpO2 100% Physical Exam  Constitutional: She is oriented to person, place, and time.  Pt appears uncomfortable and slightly pale. NAD. Not actively vomiting during our exam.  HENT:  Right Ear: External ear normal.  Left Ear: External ear normal.  Nose: Nose normal.  Mouth/Throat: Oropharynx is clear and moist. No oropharyngeal exudate.  Eyes: Conjunctivae and EOM are normal. Pupils are equal, round, and reactive to light. No scleral icterus.  Neck: Normal range of motion. Neck supple.  Cardiovascular: Normal rate, regular rhythm, normal heart sounds and intact distal pulses.   No murmur heard. Pulmonary/Chest: Effort normal and breath sounds normal. No respiratory distress. She has no wheezes.  Abdominal: Soft. Bowel sounds are normal. She exhibits no distension. There is tenderness in the epigastric area. There is no rebound, no guarding and no CVA tenderness.  Musculoskeletal: Normal range of motion. She exhibits no edema.  Lymphadenopathy:    She has no  cervical adenopathy.  Neurological: She is alert and oriented to person, place, and time. No cranial nerve deficit.  Skin: Skin is warm and dry. There is pallor.  Nursing note and vitals reviewed.   ED Course  Procedures (including critical care time) Labs Review Labs Reviewed  COMPREHENSIVE METABOLIC PANEL - Abnormal; Notable for the following:    CO2 18 (*)    Glucose, Bld 100 (*)    ALT 11 (*)    All other components within normal limits  CBC WITH DIFFERENTIAL/PLATELET  LIPASE, BLOOD    Imaging Review No results found. I have personally reviewed and evaluated these images and lab results as part of my medical decision-making.   EKG Interpretation  None      MDM   Final diagnoses:  None    10:50 AM Given reported diagnosis of H. Pylori and history of ongoing abdominal pain (previously diagnosed as PUD vs gastritis), I think this is a reasonable explanation for pt's symptoms and presentation. She also has not started triple therapy for H. Pylori yet. Pt's very recent workup at [facility] was unrevealing. She is not tachycardic, she is afebrile, and her HTN is WNL in the ED today. However, she does not look like she feels well and is quite tender on exam. I will check her basic labs to make sure nothing has changed in the interim, though I think it is unlikely. Will give one shot of dilaudid here and discussed that I cannot give more than that nor can I give her rx for more PO pain meds as she has percocet at home. Will give fluids, trial gi cocktail, zofran, and protonix. Pending labs, I anticipate dispo d/c home and encouraged her to fill the omeprazole and zofran rx given to her at [facility] long. F/u with GI in [place] as scheduled.   Pt reports relief following tx. Workup unremarkable. Will d/c home with instructions to fill omeprazole and zofran and start taking until she can see GI at home.   Vickie Ng, PA-C 04/23/15 1737  Orpah Greek, MD 04/24/15 [phone]

## 2015-04-23 NOTE — ED Notes (Signed)
Pt reports n/v for 2 days, had diarrhea yesterday a/w abd pain

## 2015-10-19 ENCOUNTER — Emergency Department (HOSPITAL_COMMUNITY)
Admission: EM | Admit: 2015-10-19 | Discharge: 2015-10-19 | Disposition: A | Discharge status: Home / Self Care | Payer: Medicaid - Out of State | Attending: Emergency Medicine | Admitting: Emergency Medicine

## 2015-10-19 ENCOUNTER — Encounter (HOSPITAL_COMMUNITY): Payer: Self-pay | Admitting: Family Medicine

## 2015-10-19 DIAGNOSIS — Z862 Personal history of diseases of the blood and blood-forming organs and certain disorders involving the immune mechanism: Secondary | ICD-10-CM | POA: Diagnosis not present

## 2015-10-19 DIAGNOSIS — M545 Low back pain: Secondary | ICD-10-CM | POA: Insufficient documentation

## 2015-10-19 DIAGNOSIS — Z791 Long term (current) use of non-steroidal anti-inflammatories (NSAID): Secondary | ICD-10-CM | POA: Insufficient documentation

## 2015-10-19 DIAGNOSIS — J45909 Unspecified asthma, uncomplicated: Secondary | ICD-10-CM | POA: Diagnosis not present

## 2015-10-19 DIAGNOSIS — Z7951 Long term (current) use of inhaled steroids: Secondary | ICD-10-CM | POA: Diagnosis not present

## 2015-10-19 DIAGNOSIS — Z792 Long term (current) use of antibiotics: Secondary | ICD-10-CM | POA: Diagnosis not present

## 2015-10-19 DIAGNOSIS — Z79899 Other long term (current) drug therapy: Secondary | ICD-10-CM | POA: Diagnosis not present

## 2015-10-19 DIAGNOSIS — Z7952 Long term (current) use of systemic steroids: Secondary | ICD-10-CM | POA: Diagnosis not present

## 2015-10-19 DIAGNOSIS — G8929 Other chronic pain: Secondary | ICD-10-CM | POA: Diagnosis not present

## 2015-10-19 DIAGNOSIS — M549 Dorsalgia, unspecified: Secondary | ICD-10-CM

## 2015-10-19 MED ORDER — OXYCODONE-ACETAMINOPHEN 5-325 MG PO TABS: 1.0000 | ORAL_TABLET | ORAL | Status: DC | PRN

## 2015-10-19 MED ORDER — OXYCODONE-ACETAMINOPHEN 5-325 MG PO TABS
2.0000 | ORAL_TABLET | Freq: Once | ORAL | Status: AC
  Administered 2015-10-19: 2 via ORAL
  Filled 2015-10-19: qty 2

## 2015-10-19 MED ORDER — CARISOPRODOL 350 MG PO TABS: 350.0000 mg | ORAL_TABLET | Freq: Four times a day (QID) | ORAL | Status: DC | PRN

## 2015-10-19 NOTE — ED Provider Notes (Signed)
CSN: [id number]     Arrival date & time 10/19/15  1441 History  By signing my name below, I, Vickie Hernandez, attest that this documentation has been prepared under the direction and in the presence of Vickie Limbo, PA-C. Electronically Signed: Eustaquio Hernandez, ED Scribe. 10/19/2015. 3:43 PM.   Chief Complaint  Patient presents with  . Back Pain    The history is provided by the patient. No language interpreter was used.     HPI Comments: Vickie Hernandez is a 45 y.o. female with PMHx DDD and herniated discs in L4-L5, who presents to the Emergency Department complaining of acute on chronic, constant, worsening, lower back pain x 2 days. No injury to the back. Pt states that she did trip but caught herself prior to falling and believes this may have exacerbated her pain. Pt lives in [place], [place] and is followed there for her back pain. She is currently in the area visiting her daughter and has recently run out of her 10 mg oxycodone which she takes for her pain. She will not return to [place] until 10/31/2015 (approximately 2 weeks from now). Denies weakness, numbness, tingling, urinary or bowel incontinence, abdominal pain, nausea, vomiting, or any other associated symptoms.    Past Medical History  Diagnosis Date  . Anemia   . Asthma    Past Surgical History  Procedure Laterality Date  . Cesarean section    . Foot surgery     History reviewed. No pertinent family history. Social History  Substance Use Topics  . Smoking status: Never Smoker   . Smokeless tobacco: None  . Alcohol Use: No   OB History    Gravida Para Term Preterm AB TAB SAB Ectopic Multiple Living   0 0 0 0 0 0 0 0        Review of Systems  Gastrointestinal: Negative for nausea, vomiting and abdominal pain.       Negative for bowel incontinence  Genitourinary:       Negative for urinary incontinence  Musculoskeletal: Positive for back pain.  Neurological: Negative for weakness and numbness.      Allergies  Shellfish allergy  Home Medications   Prior to Admission medications   Medication Sig Start Date End Date Taking? Authorizing Provider  albuterol (PROVENTIL HFA;VENTOLIN HFA) 108 (90 BASE) MCG/ACT inhaler Inhale 2 puffs into the lungs every 6 (six) hours as needed. Wheezing and shortness of breath    Historical Provider, MD  albuterol (PROVENTIL) 2 MG tablet Take 1 tablet (2 mg total) by mouth 3 (three) times daily. 12/25/11 12/24/12  Blair Heys, MD  amitriptyline (ELAVIL) 25 MG tablet Take 25 mg by mouth 3 (three) times daily as needed (for anxiety, usually takes twice daily).     Historical Provider, MD  carisoprodol (SOMA) 350 MG tablet Take 350 mg by mouth 4 (four) times daily as needed for muscle spasms.    Historical Provider, MD  DULoxetine (CYMBALTA) 30 MG capsule Take 30 mg by mouth daily.    Historical Provider, MD  DULoxetine (CYMBALTA) 60 MG capsule Take 60 mg by mouth daily as needed (may take for elevated anxiety).     Historical Provider, MD  Fluticasone-Salmeterol (ADVAIR) 250-50 MCG/DOSE AEPB Inhale 1 puff into the lungs every 12 (twelve) hours.    Historical Provider, MD  gabapentin (NEURONTIN) 300 MG capsule Take 300 mg by mouth 3 (three) times daily.    Historical Provider, MD  metroNIDAZOLE (FLAGYL) 500 MG tablet Take 1 tablet (500  mg total) by mouth 2 (two) times daily. 05/13/14   Noemi Chapel, MD  montelukast (SINGULAIR) 10 MG tablet Take 10 mg by mouth at bedtime.    Historical Provider, MD  naproxen (NAPROSYN) 500 MG tablet Take 1 tablet (500 mg total) by mouth 2 (two) times daily with a meal. 05/13/14   Noemi Chapel, MD  omeprazole (PRILOSEC) 20 MG capsule Take 1 capsule (20 mg total) by mouth daily. 04/22/15   Courteney Lyn Mackuen, MD  oxyCODONE-acetaminophen (PERCOCET/ROXICET) 5-325 MG tablet Take 1-2 tablets by mouth every 4 (four) hours as needed for severe pain. 10/19/15   Marella Chimes, PA-C  predniSONE (DELTASONE) 20 MG tablet Take 3  tablets (60 mg total) by mouth daily. 12/25/11   Blair Heys, MD  promethazine (PHENERGAN) 25 MG tablet Take 1 tablet (25 mg total) by mouth every 6 (six) hours as needed for nausea or vomiting. 04/22/15   Courteney Lyn Mackuen, MD  tiZANidine (ZANAFLEX) 4 MG tablet Take 4 mg by mouth at bedtime.    Historical Provider, MD    BP 116/93 mmHg  Pulse 72  Temp(Src) 98.1 F (36.7 C)  Resp 16  SpO2 100% Physical Exam  Constitutional: She is oriented to person, place, and time. She appears well-developed and well-nourished. No distress.  Patient appears uncomfortable due to pain.  HENT:  Head: Normocephalic and atraumatic.  Right Ear: External ear normal.  Left Ear: External ear normal.  Nose: Nose normal.  Eyes: Conjunctivae and EOM are normal. Right eye exhibits no discharge. Left eye exhibits no discharge. No scleral icterus.  Neck: Normal range of motion. Neck supple.  Cardiovascular: Normal rate and regular rhythm.   Pulmonary/Chest: Effort normal and breath sounds normal. No respiratory distress.  Musculoskeletal: Normal range of motion. She exhibits tenderness. She exhibits no edema.  Diffuse tenderness to palpation to lumbar spine and paraspinal muscles. No palpable step off or deformity. Strength, sensation, and DTRs intact.  Neurological: She is alert and oriented to person, place, and time. She has normal strength and normal reflexes. No sensory deficit.  Skin: Skin is warm and dry. She is not diaphoretic.  Psychiatric: She has a normal mood and affect. Her behavior is normal.  Nursing note and vitals reviewed.    ED Course  Procedures (including critical care time)  DIAGNOSTIC STUDIES: Oxygen Saturation is 100% on RA, normal by my interpretation.    COORDINATION OF CARE: 3:32 PM-Discussed treatment plan with pt at bedside and pt agreed to plan.   Labs Review Labs Reviewed - No data to display  Imaging Review No results found.     EKG Interpretation None        MDM   Final diagnoses:  Back pain, unspecified location    44 year old female presents with low back pain, which she states worsened 2 days ago after nearly falling. Denies numbness, weakness, paresthesia, bowel or bladder incontinence, saddles anesthesia, history of malignancy, IVDU. States she takes oxycodone for pain and is visiting from [place] and ran out of her medication. Patient is afebrile. Vital signs stable. On exam, she has diffuse TTP to her lumbar spine and paraspinal muscles. Strength, sensation, DTRs intact. Spoke with clinic where patient is managed, advised she has not been seen in over a month and has no upcoming appointment, therefore managing her pain here will not interfere with her regimen. Will give pain medication in the ED and prescribe short course for home. Patient is non-toxic and well-appearing, feel she is stable  for discharge at this time. No evidence of cauda equina, abscess, hematoma. Patient to follow-up with PCP. Strict return precautions discussed. Patient verbalizes her understanding and is in agreement with plan.  BP 116/93 mmHg  Pulse 72  Temp(Src) 98.1 F (36.7 C)  Resp 16  SpO2 100%  I personally performed the services described in this documentation, which was scribed in my presence. The recorded information has been reviewed and is accurate.    Marella Chimes, PA-C 10/19/15 [place], MD 10/21/15 1446

## 2015-10-19 NOTE — Discharge Instructions (Signed)
1. Medications: usual home medications 2. Treatment: rest, drink plenty of fluids 3. Follow Up: please followup with your primary doctor for discussion of your diagnoses and further evaluation after today's visit; if you do not have a primary care doctor use the phone number listed in your discharge paperwork to find one; please return to the ER for numbness, weakness, loss of control of your bowel or bladder, new or worsening symptoms   Back Pain, Adult Back pain is very common in adults.The cause of back pain is rarely dangerous and the pain often gets better over time.The cause of your back pain may not be known. Some common causes of back pain include:  Strain of the muscles or ligaments supporting the spine.  Wear and tear (degeneration) of the spinal disks.  Arthritis.  Direct injury to the back. For many people, back pain may return. Since back pain is rarely dangerous, most people can learn to manage this condition on their own. HOME CARE INSTRUCTIONS Watch your back pain for any changes. The following actions may help to lessen any discomfort you are feeling:  Remain active. It is stressful on your back to sit or stand in one place for long periods of time. Do not sit, drive, or stand in one place for more than 30 minutes at a time. Take short walks on even surfaces as soon as you are able.Try to increase the length of time you walk each day.  Exercise regularly as directed by your health care provider. Exercise helps your back heal faster. It also helps avoid future injury by keeping your muscles strong and flexible.  Do not stay in bed.Resting more than 1-2 days can delay your recovery.  Pay attention to your body when you bend and lift. The most comfortable positions are those that put less stress on your recovering back. Always use proper lifting techniques, including:  Bending your knees.  Keeping the load close to your body.  Avoiding twisting.  Find a comfortable  position to sleep. Use a firm mattress and lie on your side with your knees slightly bent. If you lie on your back, put a pillow under your knees.  Avoid feeling anxious or stressed.Stress increases muscle tension and can worsen back pain.It is important to recognize when you are anxious or stressed and learn ways to manage it, such as with exercise.  Take medicines only as directed by your health care provider. Over-the-counter medicines to reduce pain and inflammation are often the most helpful.Your health care provider may prescribe muscle relaxant drugs.These medicines help dull your pain so you can more quickly return to your normal activities and healthy exercise.  Apply ice to the injured area:  Put ice in a plastic bag.  Place a towel between your skin and the bag.  Leave the ice on for 20 minutes, 2-3 times a day for the first 2-3 days. After that, ice and heat may be alternated to reduce pain and spasms.  Maintain a healthy weight. Excess weight puts extra stress on your back and makes it difficult to maintain good posture. SEEK MEDICAL CARE IF:  You have pain that is not relieved with rest or medicine.  You have increasing pain going down into the legs or buttocks.  You have pain that does not improve in one week.  You have night pain.  You lose weight.  You have a fever or chills. SEEK IMMEDIATE MEDICAL CARE IF:   You develop new bowel or bladder control problems.  You have unusual weakness or numbness in your arms or legs.  You develop nausea or vomiting.  You develop abdominal pain.  You feel faint.   This information is not intended to replace advice given to you by your health care provider. Make sure you discuss any questions you have with your health care provider.   Document Released: 06/11/2005 Document Revised: 07/02/2014 Document Reviewed: 10/13/2013 Elsevier Interactive Patient Education [employer].

## 2015-10-19 NOTE — ED Notes (Addendum)
Pt here for lower back pain. Hx of DDD and herniaetd and ruptured L4 and 5. Pt sts she is here visiting her daughter and her pain meds disappeared. sts that she sees a pain doctor in [place].

## 2016-12-14 ENCOUNTER — Encounter (HOSPITAL_COMMUNITY): Payer: Self-pay

## 2016-12-14 DIAGNOSIS — R112 Nausea with vomiting, unspecified: Secondary | ICD-10-CM | POA: Diagnosis not present

## 2016-12-14 DIAGNOSIS — R1084 Generalized abdominal pain: Secondary | ICD-10-CM | POA: Insufficient documentation

## 2016-12-14 LAB — COMPREHENSIVE METABOLIC PANEL
ALBUMIN: 4.8 g/dL (ref 3.5–5.0)
ALK PHOS: 50 U/L (ref 38–126)
ALT: 10 U/L — AB (ref 14–54)
AST: 17 U/L (ref 15–41)
Anion gap: 12 (ref 5–15)
BILIRUBIN TOTAL: 1.1 mg/dL (ref 0.3–1.2)
BUN: 12 mg/dL (ref 6–20)
CALCIUM: 9.8 mg/dL (ref 8.9–10.3)
CO2: 21 mmol/L — ABNORMAL LOW (ref 22–32)
CREATININE: 0.75 mg/dL (ref 0.44–1.00)
Chloride: 108 mmol/L (ref 101–111)
GFR calc Af Amer: >60 mL/min (ref >60)
GFR calc non Af Amer: >60 mL/min (ref >60)
GLUCOSE: 113 mg/dL — AB (ref 65–99)
Potassium: 3.5 mmol/L (ref 3.5–5.1)
SODIUM: 141 mmol/L (ref 135–145)
TOTAL PROTEIN: 8.2 g/dL — AB (ref 6.5–8.1)

## 2016-12-14 LAB — CBC
HCT: 35 % — ABNORMAL LOW (ref 36.0–46.0)
HEMOGLOBIN: 12.2 g/dL (ref 12.0–15.0)
MCH: 31.6 pg (ref 26.0–34.0)
MCHC: 34.9 g/dL (ref 30.0–36.0)
MCV: 90.7 fL (ref 78.0–100.0)
Platelets: 200 10*3/uL (ref 150–400)
RBC: 3.86 MIL/uL — ABNORMAL LOW (ref 3.87–5.11)
RDW: 14 % (ref 11.5–15.5)
WBC: 5.1 10*3/uL (ref 4.0–10.5)

## 2016-12-14 LAB — LIPASE, BLOOD: Lipase: 23 U/L (ref 11–51)

## 2016-12-14 MED ORDER — ONDANSETRON 4 MG PO TBDP
4.0000 mg | ORAL_TABLET | Freq: Once | ORAL | Status: AC | PRN
  Administered 2016-12-14: 4 mg via ORAL

## 2016-12-14 MED ORDER — ONDANSETRON 4 MG PO TBDP
ORAL_TABLET | ORAL | Status: AC
  Filled 2016-12-14: qty 1

## 2016-12-14 NOTE — ED Triage Notes (Signed)
Pt complaining of nausea, vomiting and abdominal pain x 2 days. Pt states had migraine yesterday, triggered emesis. Pt states emesis x 25+ today.

## 2016-12-15 ENCOUNTER — Emergency Department (HOSPITAL_COMMUNITY)
Admission: EM | Admit: 2016-12-15 | Discharge: 2016-12-15 | Disposition: A | Discharge status: Home / Self Care | Payer: Medicaid - Out of State | Attending: Emergency Medicine | Admitting: Emergency Medicine

## 2016-12-15 ENCOUNTER — Emergency Department (HOSPITAL_COMMUNITY): Payer: Medicaid - Out of State

## 2016-12-15 DIAGNOSIS — K529 Noninfective gastroenteritis and colitis, unspecified: Secondary | ICD-10-CM

## 2016-12-15 DIAGNOSIS — R1115 Cyclical vomiting syndrome unrelated to migraine: Secondary | ICD-10-CM

## 2016-12-15 LAB — I-STAT BETA HCG BLOOD, ED (MC, WL, AP ONLY): I-stat hCG, quantitative: <5 m[IU]/mL (ref <5)

## 2016-12-15 LAB — URINALYSIS, ROUTINE W REFLEX MICROSCOPIC
BACTERIA UA: NONE SEEN
Bilirubin Urine: NEGATIVE
Glucose, UA: NEGATIVE mg/dL
Hgb urine dipstick: NEGATIVE
Ketones, ur: 20 mg/dL — AB
Leukocytes, UA: NEGATIVE
Nitrite: NEGATIVE
PROTEIN: 30 mg/dL — AB
Specific Gravity, Urine: 1.028 (ref 1.005–1.030)
pH: 5 (ref 5.0–8.0)

## 2016-12-15 MED ORDER — CIPROFLOXACIN HCL 500 MG PO TABS: 500.0000 mg | ORAL_TABLET | Freq: Two times a day (BID) | ORAL | 0 refills | Status: DC

## 2016-12-15 MED ORDER — IOPAMIDOL (ISOVUE-300) INJECTION 61%
INTRAVENOUS | Status: AC
  Filled 2016-12-15: qty 30

## 2016-12-15 MED ORDER — SODIUM CHLORIDE 0.9 % IV BOLUS (SEPSIS)
1000.0000 mL | Freq: Once | INTRAVENOUS | Status: AC
  Administered 2016-12-15: 1000 mL via INTRAVENOUS

## 2016-12-15 MED ORDER — METRONIDAZOLE 500 MG PO TABS: 500.0000 mg | ORAL_TABLET | Freq: Three times a day (TID) | ORAL | 0 refills | Status: DC

## 2016-12-15 MED ORDER — OXYCODONE-ACETAMINOPHEN 5-325 MG PO TABS: 1.0000 | ORAL_TABLET | ORAL | 0 refills | Status: DC | PRN

## 2016-12-15 MED ORDER — CIPROFLOXACIN HCL 500 MG PO TABS
500.0000 mg | ORAL_TABLET | Freq: Once | ORAL | Status: AC
  Administered 2016-12-15: 500 mg via ORAL
  Filled 2016-12-15: qty 1

## 2016-12-15 MED ORDER — PROMETHAZINE HCL 25 MG/ML IJ SOLN
25.0000 mg | Freq: Once | INTRAMUSCULAR | Status: AC
  Administered 2016-12-15: 25 mg via INTRAVENOUS
  Filled 2016-12-15: qty 1

## 2016-12-15 MED ORDER — HYDROMORPHONE HCL 1 MG/ML IJ SOLN
1.0000 mg | Freq: Once | INTRAMUSCULAR | Status: AC
  Administered 2016-12-15: 1 mg via INTRAVENOUS
  Filled 2016-12-15: qty 1

## 2016-12-15 MED ORDER — DIPHENHYDRAMINE HCL 25 MG PO CAPS
25.0000 mg | ORAL_CAPSULE | Freq: Once | ORAL | Status: AC
  Administered 2016-12-15: 25 mg via ORAL
  Filled 2016-12-15: qty 1

## 2016-12-15 MED ORDER — IOPAMIDOL (ISOVUE-300) INJECTION 61%
INTRAVENOUS | Status: AC
  Filled 2016-12-15: qty 100

## 2016-12-15 MED ORDER — METRONIDAZOLE 500 MG PO TABS
500.0000 mg | ORAL_TABLET | Freq: Once | ORAL | Status: AC
  Administered 2016-12-15: 500 mg via ORAL
  Filled 2016-12-15: qty 1

## 2016-12-15 MED ORDER — PROMETHAZINE HCL 25 MG PO TABS: 25.0000 mg | ORAL_TABLET | Freq: Four times a day (QID) | ORAL | 0 refills | Status: DC | PRN

## 2016-12-15 MED ORDER — METOCLOPRAMIDE HCL 5 MG/ML IJ SOLN
10.0000 mg | Freq: Once | INTRAMUSCULAR | Status: AC
  Administered 2016-12-15: 10 mg via INTRAVENOUS
  Filled 2016-12-15: qty 2

## 2016-12-15 NOTE — ED Notes (Signed)
Made aware by EMT that patient states she is having severe abdominal pain.  This RN approached patient, who was resting calmly, and she states pain now worse than original 10/10 rating.  States Zofran did not help, states she is already on Reglan.  This Rn apologized for the delays, confirmed no new pain or changes in symptoms, and patient updated on rooming.

## 2016-12-15 NOTE — ED Provider Notes (Signed)
TIME SEEN: 12:57 AM  CHIEF COMPLAINT: Abdominal pain  HPI: Patient is a 46 year old female with history of cyclic vomiting who lives in [place] who presents emergency department who presents emergency room 2 days of nausea and vomiting and diffuse abdominal cramping. States this feels similar to her previous symptoms. Has been told that this could be gastritis, cyclic vomiting, IBS. She is here visiting family. Denies diarrhea, bloody stools or melena. Denies dysuria, hematuria, vaginal bleeding or discharge.  She has a IUD and therefore does not have menstrual cycles. She has had previous abdominal C-section but no other surgery. No recent travel, hospitalization, antibiotic use or sick contacts.  ROS: See HPI Constitutional: no fever  Eyes: no drainage  ENT: no runny nose   Cardiovascular:  no chest pain  Resp: no SOB  GI:  vomiting GU: no dysuria Integumentary: no rash  Allergy: no hives  Musculoskeletal: no leg swelling  Neurological: no slurred speech ROS otherwise negative  PAST MEDICAL HISTORY/PAST SURGICAL HISTORY:  Past Medical History:  Diagnosis Date  . Anemia   . Asthma     MEDICATIONS:  Prior to Admission medications   Medication Sig Start Date End Date Taking? Authorizing Provider  albuterol (PROVENTIL HFA;VENTOLIN HFA) 108 (90 BASE) MCG/ACT inhaler Inhale 2 puffs into the lungs every 6 (six) hours as needed. Wheezing and shortness of breath    [provider]  albuterol (PROVENTIL) 2 MG tablet Take 1 tablet (2 mg total) by mouth 3 (three) times daily. 12/25/11 12/24/12  Blair Heys, MD  amitriptyline (ELAVIL) 25 MG tablet Take 25 mg by mouth 3 (three) times daily as needed (for anxiety, usually takes twice daily).     [provider]  carisoprodol (SOMA) 350 MG tablet Take 1 tablet (350 mg total) by mouth 4 (four) times daily as needed for muscle spasms. 10/19/15   Quintella Reichert, MD  DULoxetine (CYMBALTA) 30 MG capsule Take 30 mg by mouth daily.     [provider]  DULoxetine (CYMBALTA) 60 MG capsule Take 60 mg by mouth daily as needed (may take for elevated anxiety).     [provider]  Fluticasone-Salmeterol (ADVAIR) 250-50 MCG/DOSE AEPB Inhale 1 puff into the lungs every 12 (twelve) hours.    [provider]  gabapentin (NEURONTIN) 300 MG capsule Take 300 mg by mouth 3 (three) times daily.    [provider]  metroNIDAZOLE (FLAGYL) 500 MG tablet Take 1 tablet (500 mg total) by mouth 2 (two) times daily. 05/13/14   Noemi Chapel, MD  montelukast (SINGULAIR) 10 MG tablet Take 10 mg by mouth at bedtime.    [provider]  naproxen (NAPROSYN) 500 MG tablet Take 1 tablet (500 mg total) by mouth 2 (two) times daily with a meal. 05/13/14   Noemi Chapel, MD  omeprazole (PRILOSEC) 20 MG capsule Take 1 capsule (20 mg total) by mouth daily. 04/22/15   Mackuen, Courteney Lyn, MD  oxyCODONE-acetaminophen (PERCOCET/ROXICET) 5-325 MG tablet Take 1-2 tablets by mouth every 4 (four) hours as needed for severe pain. 10/19/15   Marella Chimes, PA-C  predniSONE (DELTASONE) 20 MG tablet Take 3 tablets (60 mg total) by mouth daily. 12/25/11   Blair Heys, MD  promethazine (PHENERGAN) 25 MG tablet Take 1 tablet (25 mg total) by mouth every 6 (six) hours as needed for nausea or vomiting. 04/22/15   Mackuen, Courteney Lyn, MD  tiZANidine (ZANAFLEX) 4 MG tablet Take 4 mg by mouth at bedtime.    [provider]  ALLERGIES:  Allergies  Allergen Reactions  . Bee Venom     Hives  . Shellfish Allergy Swelling    SOCIAL HISTORY:  Social History  Substance Use Topics  . Smoking status: Never Smoker  . Smokeless tobacco: Never Used  . Alcohol use No    FAMILY HISTORY: History reviewed. No pertinent family history.  EXAM: BP 110/62   Pulse 65   Temp 99 F (37.2 C) (Oral)   Resp 16   SpO2 100%  CONSTITUTIONAL: Alert and oriented and responds appropriately to questions. Well-appearing;  well-nourished HEAD: Normocephalic EYES: Conjunctivae clear, pupils appear equal, EOMI ENT: normal nose; moist mucous membranes NECK: Supple, no meningismus, no nuchal rigidity, no LAD  CARD: RRR; S1 and S2 appreciated; no murmurs, no clicks, no rubs, no gallops RESP: Normal chest excursion without splinting or tachypnea; breath sounds clear and equal bilaterally; no wheezes, no rhonchi, no rales, no hypoxia or respiratory distress, speaking full sentences ABD/GI: Normal bowel sounds; non-distended; soft, Tender palpation throughout the entire abdomen with intermittent voluntary guarding, no rebound,, no peritoneal signs, no hepatosplenomegaly BACK:  The back appears normal and is non-tender to palpation, there is no CVA tenderness EXT: Normal ROM in all joints; non-tender to palpation; no edema; normal capillary refill; no cyanosis, no calf tenderness or swelling    SKIN: Normal color for age and race; warm; no rash NEURO: Moves all extremities equally PSYCH: The patient's mood and manner are appropriate. Grooming and personal hygiene are appropriate.  MEDICAL DECISION MAKING: Patient here with abdominal pain, vomiting. Reports history of cyclic vomiting has been told it could be gastritis, IBS. Is from [place] and is here visiting. She is very tender throughout her abdomen. We'll obtain CT of her abdomen for further evaluation. Will give IV fluids, pain and nausea medication. Will obtain labs, urinalysis. Differential diagnosis includes gastritis, bowel resection, colitis, diverticulitis, appendicitis, cholecystitis, UTI.  ED PROGRESS: 5:30 AM  Patient's CT scan shows transverse and ascending colon that are nondistended and difficult to exclude mild colonic wall thickening/colitis. Otherwise no acute abnormality. Given she does have abdominal pain along the transverse and ascending colon I do not feel he ran reasonable to start her on antibiotics and she agrees. She would prefer treatment. Given  she is well-appearing and has not had any vomiting here, no bloody stools or melena and has a nonsurgical abdomen and feel she can likely be discharged on oral antibiotics, pain and nausea medication. She is also comfortable with this plan. Urinalysis pending. We'll fluid challenge. Patient improved after one dose of Dilaudid and Reglan. Still complaining of some pain and nausea. We'll give Dilaudid and Phenergan.    6:45 AM  Pt's urinalysis shows no obvious infection. Does so small ketones but she has received IV fluids and is drinking currently. She is not had any vomiting here in the emergency department. We'll discharge with brief prescription of Percocet for pain control, Phenergan, Cipro and Flagyl for possible colitis. Discussed return cautions. Patient verbalizes understanding and is comfortable with this plan.   At this time, I do not feel there is any life-threatening condition present. I have reviewed and discussed all results (EKG, imaging, lab, urine as appropriate) and exam findings with patient/family. I have reviewed nursing notes and appropriate previous records.  I feel the patient is safe to be discharged home without further emergent workup and can continue workup as an outpatient as needed. Discussed usual and customary return precautions. Patient/family verbalize understanding and are comfortable with this  plan.  Outpatient follow-up has been provided if needed. All questions have been answered.     Ward, Delice Bison, DO 12/15/16 [phone]

## 2017-04-24 ENCOUNTER — Encounter (HOSPITAL_COMMUNITY): Payer: Self-pay

## 2017-04-24 ENCOUNTER — Emergency Department (HOSPITAL_COMMUNITY)
Admission: EM | Admit: 2017-04-24 | Discharge: 2017-04-24 | Disposition: A | Discharge status: Home / Self Care | Payer: Medicaid - Out of State | Attending: Emergency Medicine | Admitting: Emergency Medicine

## 2017-04-24 DIAGNOSIS — J45909 Unspecified asthma, uncomplicated: Secondary | ICD-10-CM | POA: Insufficient documentation

## 2017-04-24 DIAGNOSIS — K529 Noninfective gastroenteritis and colitis, unspecified: Secondary | ICD-10-CM | POA: Diagnosis not present

## 2017-04-24 DIAGNOSIS — Z79899 Other long term (current) drug therapy: Secondary | ICD-10-CM | POA: Diagnosis not present

## 2017-04-24 DIAGNOSIS — R109 Unspecified abdominal pain: Secondary | ICD-10-CM | POA: Diagnosis present

## 2017-04-24 LAB — URINALYSIS, MICROSCOPIC (REFLEX)

## 2017-04-24 LAB — I-STAT CHEM 8, ED
BUN: 10 mg/dL (ref 6–20)
CALCIUM ION: 1.11 mmol/L — AB (ref 1.15–1.40)
Chloride: 110 mmol/L (ref 101–111)
Creatinine, Ser: 0.6 mg/dL (ref 0.44–1.00)
GLUCOSE: 115 mg/dL — AB (ref 65–99)
HCT: 33 % — ABNORMAL LOW (ref 36.0–46.0)
HEMOGLOBIN: 11.2 g/dL — AB (ref 12.0–15.0)
POTASSIUM: 3.3 mmol/L — AB (ref 3.5–5.1)
SODIUM: 143 mmol/L (ref 135–145)
TCO2: 21 mmol/L — ABNORMAL LOW (ref 22–32)

## 2017-04-24 LAB — URINALYSIS, ROUTINE W REFLEX MICROSCOPIC
BILIRUBIN URINE: NEGATIVE
Glucose, UA: NEGATIVE mg/dL
Hgb urine dipstick: NEGATIVE
LEUKOCYTES UA: NEGATIVE
NITRITE: NEGATIVE
Protein, ur: 30 mg/dL — AB
SPECIFIC GRAVITY, URINE: 1.015 (ref 1.005–1.030)

## 2017-04-24 LAB — LIPASE, BLOOD: Lipase: 18 U/L (ref 11–51)

## 2017-04-24 LAB — CBC
HEMATOCRIT: 34.1 % — AB (ref 36.0–46.0)
HEMOGLOBIN: 11.7 g/dL — AB (ref 12.0–15.0)
MCH: 31.7 pg (ref 26.0–34.0)
MCHC: 34.3 g/dL (ref 30.0–36.0)
MCV: 92.4 fL (ref 78.0–100.0)
Platelets: 222 10*3/uL (ref 150–400)
RBC: 3.69 MIL/uL — ABNORMAL LOW (ref 3.87–5.11)
RDW: 13.3 % (ref 11.5–15.5)
WBC: 6.5 10*3/uL (ref 4.0–10.5)

## 2017-04-24 LAB — COMPREHENSIVE METABOLIC PANEL
ALK PHOS: 72 U/L (ref 38–126)
ALT: 13 U/L — ABNORMAL LOW (ref 14–54)
ANION GAP: 15 (ref 5–15)
AST: 24 U/L (ref 15–41)
Albumin: 4.6 g/dL (ref 3.5–5.0)
BILIRUBIN TOTAL: 0.9 mg/dL (ref 0.3–1.2)
BUN: 9 mg/dL (ref 6–20)
CALCIUM: 9.7 mg/dL (ref 8.9–10.3)
CO2: 16 mmol/L — ABNORMAL LOW (ref 22–32)
Chloride: 107 mmol/L (ref 101–111)
Creatinine, Ser: 0.74 mg/dL (ref 0.44–1.00)
GLUCOSE: 145 mg/dL — AB (ref 65–99)
POTASSIUM: 3.1 mmol/L — AB (ref 3.5–5.1)
Sodium: 138 mmol/L (ref 135–145)
TOTAL PROTEIN: 7.9 g/dL (ref 6.5–8.1)

## 2017-04-24 LAB — I-STAT BETA HCG BLOOD, ED (MC, WL, AP ONLY)

## 2017-04-24 MED ORDER — GLYCOPYRROLATE 0.2 MG/ML IJ SOLN
0.2000 mg | Freq: Once | INTRAMUSCULAR | Status: AC
  Administered 2017-04-24: 0.2 mg via INTRAVENOUS
  Filled 2017-04-24: qty 1

## 2017-04-24 MED ORDER — ONDANSETRON 4 MG PO TBDP: 4.0000 mg | ORAL_TABLET | Freq: Three times a day (TID) | ORAL | 0 refills | Status: DC | PRN

## 2017-04-24 MED ORDER — DIPHENOXYLATE-ATROPINE 2.5-0.025 MG PO TABS: 1.0000 | ORAL_TABLET | Freq: Four times a day (QID) | ORAL | 0 refills | Status: DC | PRN

## 2017-04-24 MED ORDER — DICYCLOMINE HCL 20 MG PO TABS: 20.0000 mg | ORAL_TABLET | Freq: Two times a day (BID) | ORAL | 0 refills | Status: DC

## 2017-04-24 MED ORDER — SODIUM CHLORIDE 0.9 % IV BOLUS (SEPSIS)
1000.0000 mL | Freq: Once | INTRAVENOUS | Status: AC
  Administered 2017-04-24: 1000 mL via INTRAVENOUS

## 2017-04-24 MED ORDER — MORPHINE SULFATE (PF) 4 MG/ML IV SOLN
4.0000 mg | Freq: Once | INTRAVENOUS | Status: AC
  Administered 2017-04-24: 4 mg via INTRAVENOUS
  Filled 2017-04-24: qty 1

## 2017-04-24 MED ORDER — DIPHENHYDRAMINE HCL 50 MG/ML IJ SOLN
25.0000 mg | Freq: Once | INTRAMUSCULAR | Status: AC
  Administered 2017-04-24: 25 mg via INTRAVENOUS
  Filled 2017-04-24: qty 1

## 2017-04-24 MED ORDER — ONDANSETRON HCL 4 MG/2ML IJ SOLN
4.0000 mg | Freq: Once | INTRAMUSCULAR | Status: AC
  Administered 2017-04-24: 4 mg via INTRAVENOUS
  Filled 2017-04-24: qty 2

## 2017-04-24 MED ORDER — PROMETHAZINE HCL 25 MG/ML IJ SOLN
12.5000 mg | Freq: Once | INTRAMUSCULAR | Status: AC
  Administered 2017-04-24: 12.5 mg via INTRAVENOUS
  Filled 2017-04-24: qty 1

## 2017-04-24 NOTE — ED Triage Notes (Addendum)
Patient complains of abdominal cramping and pain x 1 day with nausea and vomiting. States that she has these symptoms every 30 days, alert and oriented, denies fever, denies dysuria. vomited x 1 at triage

## 2017-04-24 NOTE — Discharge Instructions (Signed)
Daily or heavy marijuana use can lead to refractory episodes of abdominal pain, vomiting. You should strongly consider discontinuing marijuana use.  See your gastroenterologist. You may want to discuss the possibility of testing for Carcinoid syndrome.  Zofran for nausea, Bentyl for cramps, Lomotil for diarrhea.  Clear liquids. Slowly advance your diet.

## 2017-04-24 NOTE — ED Provider Notes (Addendum)
[facility] EMERGENCY DEPARTMENT Provider Note   CSN: [id number] Arrival date & time: 04/24/17  9675     History   Chief Complaint No chief complaint on file.   HPI Vickie Hernandez is a 46 y.o. female. Complaint is abdominal pain, nausea, vomiting, and diarrhea.  HPI:  46 year old female. Reports awakening this morning with an urged bowel movement. She states she began sweating heavily then developed cramping in her abdomen with refractory vomiting and diarrhea since that time. She states that this happens about once every 30 days or so. Has seen primary care, as well as GI without definitive diagnosis. Has "pills" at home for nausea but cannot keep them down. No hematemesis. No bloody stools. No fever. No recent ill exposures.  Past Medical History:  Diagnosis Date  . Anemia   . Asthma     There are no active problems to display for this patient.   Past Surgical History:  Procedure Laterality Date  . CESAREAN SECTION    . FOOT SURGERY      OB History    Gravida Para Term Preterm AB Living   0 0 0 0 0     SAB TAB Ectopic Multiple Live Births   0 0 0           Home Medications    Prior to Admission medications   Medication Sig Start Date End Date Taking? Authorizing Provider  albuterol (PROVENTIL HFA;VENTOLIN HFA) 108 (90 BASE) MCG/ACT inhaler Inhale 2 puffs into the lungs every 6 (six) hours as needed. Wheezing and shortness of breath   Yes [provider]  amitriptyline (ELAVIL) 50 MG tablet Take 50 mg by mouth at bedtime.   Yes [provider]  Fluticasone-Salmeterol (ADVAIR) 250-50 MCG/DOSE AEPB Inhale 1 puff into the lungs every 12 (twelve) hours.   Yes [provider]  lubiprostone (AMITIZA) 24 MCG capsule Take 24 mcg by mouth 2 (two) times daily with a meal.   Yes [provider]  metoCLOPramide (REGLAN) 5 MG tablet Take 5 mg by mouth 3 (three) times daily before meals.   Yes [provider]    oxyCODONE-acetaminophen (PERCOCET/ROXICET) 5-325 MG tablet Take 1 tablet by mouth every 4 (four) hours as needed. 12/15/16  Yes Ward, Kristen N, DO  pantoprazole (PROTONIX) 40 MG tablet Take 40 mg by mouth daily.   Yes [provider]  PARoxetine (PAXIL) 20 MG tablet Take 20 mg by mouth daily.   Yes [provider]  pregabalin (LYRICA) 75 MG capsule Take 75 mg by mouth 2 (two) times daily.   Yes [provider]  sucralfate (CARAFATE) 1 GM/10ML suspension Take 1 g by mouth 4 (four) times daily -  with meals and at bedtime.   Yes [provider]  SUMAtriptan (IMITREX) 100 MG tablet Take 100 mg by mouth every 2 (two) hours as needed for migraine. May repeat in 2 hours if headache persists or recurs.   Yes [provider]  trazodone (DESYREL) 300 MG tablet Take 300 mg by mouth at bedtime.   Yes [provider]  albuterol (PROVENTIL) 2 MG tablet Take 1 tablet (2 mg total) by mouth 3 (three) times daily. 12/25/11 12/24/12  Blair Heys, MD  ciprofloxacin (CIPRO) 500 MG tablet Take 1 tablet (500 mg total) by mouth 2 (two) times daily. Patient not taking: Reported on 04/24/2017 12/15/16   Ward, Delice Bison, DO  dicyclomine (BENTYL) 20 MG tablet Take 1 tablet (20 mg total) by  mouth 2 (two) times daily. 04/24/17   Tanna Furry, MD  diphenoxylate-atropine (LOMOTIL) 2.5-0.025 MG tablet Take 1 tablet by mouth 4 (four) times daily as needed for diarrhea or loose stools. 04/24/17   Tanna Furry, MD  metroNIDAZOLE (FLAGYL) 500 MG tablet Take 1 tablet (500 mg total) by mouth 3 (three) times daily. Patient not taking: Reported on 04/24/2017 12/15/16   Ward, Delice Bison, DO  montelukast (SINGULAIR) 10 MG tablet Take 10 mg by mouth at bedtime.    [provider]  omeprazole (PRILOSEC) 20 MG capsule Take 1 capsule (20 mg total) by mouth daily. Patient not taking: Reported on 04/24/2017 04/22/15   Mackuen, Courteney Lyn, MD  ondansetron (ZOFRAN ODT) 4 MG  disintegrating tablet Take 1 tablet (4 mg total) by mouth every 8 (eight) hours as needed for nausea. 04/24/17   Tanna Furry, MD  promethazine (PHENERGAN) 25 MG tablet Take 1 tablet (25 mg total) by mouth every 6 (six) hours as needed for nausea or vomiting. Patient not taking: Reported on 04/24/2017 12/15/16   Ward, Delice Bison, DO  tiZANidine (ZANAFLEX) 4 MG tablet Take 4 mg by mouth at bedtime.    [provider]    Family History No family history on file.  Social History Social History  Substance Use Topics  . Smoking status: Never Smoker  . Smokeless tobacco: Never Used  . Alcohol use No     Allergies   Bee venom and Shellfish allergy   Review of Systems Review of Systems  Constitutional: Negative for appetite change, chills, diaphoresis, fatigue and fever.  HENT: Negative for mouth sores, sore throat and trouble swallowing.   Eyes: Negative for visual disturbance.  Respiratory: Negative for cough, chest tightness, shortness of breath and wheezing.   Cardiovascular: Negative for chest pain.  Gastrointestinal: Positive for abdominal pain, diarrhea, nausea and vomiting. Negative for abdominal distention.  Endocrine: Negative for polydipsia, polyphagia and polyuria.  Genitourinary: Negative for dysuria, frequency and hematuria.  Musculoskeletal: Negative for gait problem.  Skin: Negative for color change, pallor and rash.  Neurological: Negative for dizziness, syncope, light-headedness and headaches.  Hematological: Does not bruise/bleed easily.  Psychiatric/Behavioral: Negative for behavioral problems and confusion.     Physical Exam Updated Vital Signs BP (!) 144/99   Pulse 97   Temp 98.2 F (36.8 C) (Oral)   Resp 16   SpO2 100%   Physical Exam  Constitutional: She is oriented to person, place, and time. She appears well-developed and well-nourished. No distress.  HENT:  Head: Normocephalic.  Eyes: Pupils are equal, round, and reactive to light.  Conjunctivae are normal. No scleral icterus.  Neck: Normal range of motion. Neck supple. No thyromegaly present.  Cardiovascular: Normal rate and regular rhythm.  Exam reveals no gallop and no friction rub.   No murmur heard. Pulmonary/Chest: Effort normal and breath sounds normal. No respiratory distress. She has no wheezes. She has no rales.  Abdominal: Soft. Bowel sounds are normal. She exhibits no distension. There is no tenderness. There is no rebound.  Musculoskeletal: Normal range of motion.  Neurological: She is alert and oriented to person, place, and time.  Skin: Skin is warm and dry. No rash noted.  Psychiatric: She has a normal mood and affect. Her behavior is normal.     ED Treatments / Results  Labs (all labs ordered are listed, but only abnormal results are displayed) Labs Reviewed  COMPREHENSIVE METABOLIC PANEL - Abnormal; Notable for the following:  Result Value   Potassium 3.1 (*)    CO2 16 (*)    Glucose, Bld 145 (*)    ALT 13 (*)    All other components within normal limits  CBC - Abnormal; Notable for the following:    RBC 3.69 (*)    Hemoglobin 11.7 (*)    HCT 34.1 (*)    All other components within normal limits  URINALYSIS, ROUTINE W REFLEX MICROSCOPIC - Abnormal; Notable for the following:    pH >9.0 (*)    Ketones, ur >80 (*)    Protein, ur 30 (*)    All other components within normal limits  URINALYSIS, MICROSCOPIC (REFLEX) - Abnormal; Notable for the following:    Bacteria, UA RARE (*)    Squamous Epithelial / LPF 0-5 (*)    All other components within normal limits  I-STAT CHEM 8, ED - Abnormal; Notable for the following:    Potassium 3.3 (*)    Glucose, Bld 115 (*)    Calcium, Ion 1.11 (*)    TCO2 21 (*)    Hemoglobin 11.2 (*)    HCT 33.0 (*)    All other components within normal limits  LIPASE, BLOOD  I-STAT BETA HCG BLOOD, ED ([facility], [facility], [facility] ONLY)    EKG  EKG Interpretation None       Radiology No results  found.  Procedures Procedures (including critical care time)  Medications Ordered in ED Medications  ondansetron (ZOFRAN) injection 4 mg (4 mg Intravenous Given 04/24/17 0919)  glycopyrrolate (ROBINUL) injection 0.2 mg (0.2 mg Intravenous Given 04/24/17 0919)  sodium chloride 0.9 % bolus 1,000 mL (1,000 mLs Intravenous New Bag/Given 04/24/17 0919)  sodium chloride 0.9 % bolus 1,000 mL (1,000 mLs Intravenous New Bag/Given 04/24/17 1038)  promethazine (PHENERGAN) injection 12.5 mg (12.5 mg Intravenous Given 04/24/17 1038)  morphine 4 MG/ML injection 4 mg (4 mg Intravenous Given 04/24/17 1038)  diphenhydrAMINE (BENADRYL) injection 25 mg (25 mg Intravenous Given 04/24/17 1038)     Initial Impression / Assessment and Plan / ED Course  I have reviewed the triage vital signs and the nursing notes.  Pertinent labs & imaging results that were available during my care of the patient were reviewed by me and considered in my medical decision making (see chart for details).   episodic vomiting and diarrhea. Has had endoscopy and CT scans without formal diagnosis. She does use marijuana. Used to use daily. Now states he has "medical marijuana" for her back. Does not use daily but often. We did discuss the possible hematic effects of THC hyperemesis syndrome and have cautioned her about continued daily or frequent or heavy use. Does get episodes that start with diaphoresis. Has a history of asthma but does not get acute bronchospasm with the episodes, doubt carcinoid. However, have asked her to discuss the marijuana use, and possible testing for carcinoid with her GI doctor.  Bicarbonate 16. Potassium 3.1. Likely shift. Plan to additional fluids. We'll recheck basic. Some improvement with symptoms but incomplete. Heart rate down to 95. Plan additional liter fluid, Phenergan, recheck BMP.  Final Clinical Impressions(s) / ED Diagnoses   Final diagnoses:  Gastroenteritis   Acidosis has cleared. Has  produced urine. Symptoms improving but not completely resolved. Benign abdomen. Plan is discharge home. Continue marijuana. Discussed carcinoid with her neurologist. Symptomatically treatment Lomotil, Bentyl, Zofran. Clear liquids. Slowly advancing. Return precautions discussed.   New Prescriptions New Prescriptions   DICYCLOMINE (BENTYL) 20 MG TABLET    Take 1  tablet (20 mg total) by mouth 2 (two) times daily.   DIPHENOXYLATE-ATROPINE (LOMOTIL) 2.5-0.025 MG TABLET    Take 1 tablet by mouth 4 (four) times daily as needed for diarrhea or loose stools.   ONDANSETRON (ZOFRAN ODT) 4 MG DISINTEGRATING TABLET    Take 1 tablet (4 mg total) by mouth every 8 (eight) hours as needed for nausea.     Tanna Furry, MD 04/24/17 1213    Tanna Furry, MD 04/24/17 1213

## 2017-04-24 NOTE — ED Notes (Signed)
Pt feels much better, receiving last liter of saline and then will be d/c'ed

## 2017-09-19 ENCOUNTER — Emergency Department (HOSPITAL_COMMUNITY)
Admission: EM | Admit: 2017-09-19 | Discharge: 2017-09-19 | Disposition: A | Discharge status: Home / Self Care | Payer: Medicaid - Out of State | Attending: Physician Assistant | Admitting: Physician Assistant

## 2017-09-19 ENCOUNTER — Encounter (HOSPITAL_COMMUNITY): Payer: Self-pay | Admitting: Emergency Medicine

## 2017-09-19 ENCOUNTER — Other Ambulatory Visit: Payer: Self-pay

## 2017-09-19 DIAGNOSIS — R1110 Vomiting, unspecified: Secondary | ICD-10-CM | POA: Diagnosis present

## 2017-09-19 DIAGNOSIS — J45909 Unspecified asthma, uncomplicated: Secondary | ICD-10-CM | POA: Diagnosis not present

## 2017-09-19 DIAGNOSIS — R112 Nausea with vomiting, unspecified: Secondary | ICD-10-CM

## 2017-09-19 DIAGNOSIS — R109 Unspecified abdominal pain: Secondary | ICD-10-CM | POA: Diagnosis not present

## 2017-09-19 DIAGNOSIS — Z79899 Other long term (current) drug therapy: Secondary | ICD-10-CM | POA: Insufficient documentation

## 2017-09-19 LAB — CBC
HEMATOCRIT: 37.4 % (ref 36.0–46.0)
HEMOGLOBIN: 12.8 g/dL (ref 12.0–15.0)
MCH: 31.8 pg (ref 26.0–34.0)
MCHC: 34.2 g/dL (ref 30.0–36.0)
MCV: 92.8 fL (ref 78.0–100.0)
Platelets: 232 10*3/uL (ref 150–400)
RBC: 4.03 MIL/uL (ref 3.87–5.11)
RDW: 13.8 % (ref 11.5–15.5)
WBC: 7.6 10*3/uL (ref 4.0–10.5)

## 2017-09-19 LAB — COMPREHENSIVE METABOLIC PANEL
ALBUMIN: 4.7 g/dL (ref 3.5–5.0)
ALK PHOS: 77 U/L (ref 38–126)
ALT: 15 U/L (ref 14–54)
AST: 26 U/L (ref 15–41)
Anion gap: 11 (ref 5–15)
BUN: 13 mg/dL (ref 6–20)
CALCIUM: 9.9 mg/dL (ref 8.9–10.3)
CHLORIDE: 107 mmol/L (ref 101–111)
CO2: 19 mmol/L — AB (ref 22–32)
Creatinine, Ser: 0.77 mg/dL (ref 0.44–1.00)
GFR calc non Af Amer: >60 mL/min (ref >60)
GLUCOSE: 144 mg/dL — AB (ref 65–99)
POTASSIUM: 3.8 mmol/L (ref 3.5–5.1)
Sodium: 137 mmol/L (ref 135–145)
Total Bilirubin: 1.1 mg/dL (ref 0.3–1.2)
Total Protein: 8.2 g/dL — ABNORMAL HIGH (ref 6.5–8.1)

## 2017-09-19 LAB — URINALYSIS, ROUTINE W REFLEX MICROSCOPIC
BACTERIA UA: NONE SEEN
Bilirubin Urine: NEGATIVE
Glucose, UA: NEGATIVE mg/dL
Hgb urine dipstick: NEGATIVE
KETONES UR: 80 mg/dL — AB
Leukocytes, UA: NEGATIVE
NITRITE: NEGATIVE
Protein, ur: 30 mg/dL — AB
Specific Gravity, Urine: 1.027 (ref 1.005–1.030)
pH: 5 (ref 5.0–8.0)

## 2017-09-19 LAB — LIPASE, BLOOD: LIPASE: 19 U/L (ref 11–51)

## 2017-09-19 MED ORDER — CAPSAICIN 0.025 % EX CREA
TOPICAL_CREAM | Freq: Once | CUTANEOUS | Status: DC
  Filled 2017-09-19: qty 60

## 2017-09-19 MED ORDER — ONDANSETRON HCL 4 MG PO TABS: 4.0000 mg | ORAL_TABLET | Freq: Three times a day (TID) | ORAL | 0 refills | Status: DC | PRN

## 2017-09-19 MED ORDER — SODIUM CHLORIDE 0.9 % IV BOLUS
1000.0000 mL | Freq: Once | INTRAVENOUS | Status: AC
  Administered 2017-09-19: 1000 mL via INTRAVENOUS

## 2017-09-19 MED ORDER — HALOPERIDOL LACTATE 5 MG/ML IJ SOLN
5.0000 mg | Freq: Once | INTRAMUSCULAR | Status: AC
  Administered 2017-09-19: 5 mg via INTRAVENOUS
  Filled 2017-09-19: qty 1

## 2017-09-19 NOTE — ED Notes (Signed)
Pt placed on cardiac monitor prior to haldol admin

## 2017-09-19 NOTE — ED Triage Notes (Signed)
Patient complains of abdominal pain, nausea, and vomiting that started a yesterday. Patient states she has had multiple episodes similar to this one but has never been able to get a diagnosis to determine the cause. Patient alert and oriented and in no apparent distress at this time.

## 2017-09-19 NOTE — ED Provider Notes (Signed)
[facility] DEPART[facility] Provider Note   CSN: [id number] Arrival date & time: 09/19/17  1047     History   Chief Complaint Chief Complaint  Patient presents with  . Abdominal Pain    HPI   Blood pressure 128/84, pulse 78, temperature 98.9 F (37.2 C), temperature source Oral, resp. rate 16, height 5\' 5"  (1.651 m), weight 77.1 kg (170 lb), SpO2 100 %.  Vickie Hernandez is a 47 y.o. female complaining of multiple of nonbloody, nonbilious, non-coffee-ground emesis with diffuse abdominal pain onset yesterday.  She is visiting from [place] she has had multiple similar episodes and she states that no one can find a reason why.  She does smoke marijuana and she was told in the past that this is cannabinoid hyperemesis, however, she does not believe that.  She has no sick contacts, no fever, chills, diarrhea, change in urination or abnormal vaginal discharge.  She states that the abdominal pain is colicky she cannot define an exacerbating or alleviating factor.  No pain medications taken prior to arrival.  Past Medical History:  Diagnosis Date  . Anemia   . Asthma     There are no active problems to display for this patient.   Past Surgical History:  Procedure Laterality Date  . CESAREAN SECTION    . FOOT SURGERY       OB History    Gravida  0   Para  0   Term  0   Preterm  0   AB  0   Living        SAB  0   TAB  0   Ectopic  0   Multiple      Live Births               Home Medications    Prior to Admission medications   Medication Sig Start Date End Date Taking? Authorizing Provider  albuterol (PROVENTIL HFA;VENTOLIN HFA) 108 (90 BASE) MCG/ACT inhaler Inhale 2 puffs into the lungs every 6 (six) hours as needed. Wheezing and shortness of breath    [provider]  albuterol (PROVENTIL) 2 MG tablet Take 1 tablet (2 mg total) by mouth 3 (three) times daily. 12/25/11 12/24/12  Blair Heys, MD  amitriptyline (ELAVIL) 50  MG tablet Take 50 mg by mouth at bedtime.    [provider]  ciprofloxacin (CIPRO) 500 MG tablet Take 1 tablet (500 mg total) by mouth 2 (two) times daily. Patient not taking: Reported on 04/24/2017 12/15/16   Ward, Delice Bison, DO  dicyclomine (BENTYL) 20 MG tablet Take 1 tablet (20 mg total) by mouth 2 (two) times daily. 04/24/17   Tanna Furry, MD  diphenoxylate-atropine (LOMOTIL) 2.5-0.025 MG tablet Take 1 tablet by mouth 4 (four) times daily as needed for diarrhea or loose stools. 04/24/17   Tanna Furry, MD  Fluticasone-Salmeterol (ADVAIR) 250-50 MCG/DOSE AEPB Inhale 1 puff into the lungs every 12 (twelve) hours.    [provider]  lubiprostone (AMITIZA) 24 MCG capsule Take 24 mcg by mouth 2 (two) times daily with a meal.    [provider]  metoCLOPramide (REGLAN) 5 MG tablet Take 5 mg by mouth 3 (three) times daily before meals.    [provider]  metroNIDAZOLE (FLAGYL) 500 MG tablet Take 1 tablet (500 mg total) by mouth 3 (three) times daily. Patient not taking: Reported on 04/24/2017 12/15/16   Ward, Cyril Mourning N, DO  montelukast (SINGULAIR) 10 MG tablet Take 10  mg by mouth at bedtime.    [provider]  omeprazole (PRILOSEC) 20 MG capsule Take 1 capsule (20 mg total) by mouth daily. Patient not taking: Reported on 04/24/2017 04/22/15   Mackuen, Courteney Lyn, MD  ondansetron (ZOFRAN ODT) 4 MG disintegrating tablet Take 1 tablet (4 mg total) by mouth every 8 (eight) hours as needed for nausea. 04/24/17   Tanna Furry, MD  ondansetron (ZOFRAN) 4 MG tablet Take 1 tablet (4 mg total) by mouth every 8 (eight) hours as needed for nausea or vomiting. 09/19/17   Suzzanne Brunkhorst, Elmyra Ricks, PA-C  oxyCODONE-acetaminophen (PERCOCET/ROXICET) 5-325 MG tablet Take 1 tablet by mouth every 4 (four) hours as needed. 12/15/16   Ward, Delice Bison, DO  pantoprazole (PROTONIX) 40 MG tablet Take 40 mg by mouth daily.    [provider]  PARoxetine (PAXIL) 20 MG tablet Take 20  mg by mouth daily.    [provider]  pregabalin (LYRICA) 75 MG capsule Take 75 mg by mouth 2 (two) times daily.    [provider]  promethazine (PHENERGAN) 25 MG tablet Take 1 tablet (25 mg total) by mouth every 6 (six) hours as needed for nausea or vomiting. Patient not taking: Reported on 04/24/2017 12/15/16   Ward, Delice Bison, DO  sucralfate (CARAFATE) 1 GM/10ML suspension Take 1 g by mouth 4 (four) times daily -  with meals and at bedtime.    [provider]  SUMAtriptan (IMITREX) 100 MG tablet Take 100 mg by mouth every 2 (two) hours as needed for migraine. May repeat in 2 hours if headache persists or recurs.    [provider]  tiZANidine (ZANAFLEX) 4 MG tablet Take 4 mg by mouth at bedtime.    [provider]  trazodone (DESYREL) 300 MG tablet Take 300 mg by mouth at bedtime.    [provider]    Family History No family history on file.  Social History Social History   Tobacco Use  . Smoking status: Never Smoker  . Smokeless tobacco: Never Used  Substance Use Topics  . Alcohol use: No  . Drug use: No     Allergies   Bee venom and Shellfish allergy   Review of Systems Review of Systems  A complete review of systems was obtained and all systems are negative except as noted in the HPI and PMH.   Physical Exam Updated Vital Signs BP 117/76   Pulse 89   Temp 98.9 F (37.2 C) (Oral)   Resp 18   Ht 5\' 5"  (1.651 m)   Wt 77.1 kg (170 lb)   SpO2 99%   BMI 28.29 kg/m   Physical Exam  Constitutional: She is oriented to person, place, and time. She appears well-developed and well-nourished. No distress.  HENT:  Head: Normocephalic and atraumatic.  Mouth/Throat: Oropharynx is clear and moist.  Eyes: Pupils are equal, round, and reactive to light. Conjunctivae and EOM are normal.  Neck: Normal range of motion.  Cardiovascular: Normal rate, regular rhythm and intact distal pulses.  Pulmonary/Chest: Effort normal  and breath sounds normal. No stridor. No respiratory distress. She has no wheezes. She has no rales. She exhibits no tenderness.  Abdominal: Soft. There is no tenderness.  Mild, diffuse tenderness to palpation with no guarding or rebound.  Murphy sign negative, no tenderness to palpation over McBurney's point, Rovsings, Psoas and obturator all negative.   Musculoskeletal: Normal range of motion.  Neurological: She is alert and oriented to person, place, and time.  Skin: She is not diaphoretic.  Psychiatric: She has a normal mood and affect.  Nursing note and vitals reviewed.    ED Treatments / Results  Labs (all labs ordered are listed, but only abnormal results are displayed) Labs Reviewed  COMPREHENSIVE METABOLIC PANEL - Abnormal; Notable for the following components:      Result Value   CO2 19 (*)    Glucose, Bld 144 (*)    Total Protein 8.2 (*)    All other components within normal limits  URINALYSIS, ROUTINE W REFLEX MICROSCOPIC - Abnormal; Notable for the following components:   APPearance HAZY (*)    Ketones, ur 80 (*)    Protein, ur 30 (*)    Squamous Epithelial / LPF 0-5 (*)    All other components within normal limits  LIPASE, BLOOD  CBC    EKG None  Radiology No results found.  Procedures Procedures (including critical care time)  Medications Ordered in ED Medications  capsaicin (ZOSTRIX) 0.025 % cream ( Topical Refused 09/19/17 1550)  haloperidol lactate (HALDOL) injection 5 mg (5 mg Intravenous Given 09/19/17 1441)  sodium chloride 0.9 % bolus 1,000 mL (0 mLs Intravenous Stopped 09/19/17 1611)     Initial Impression / Assessment and Plan / ED Course  I have reviewed the triage vital signs and the nursing notes.  Pertinent labs & imaging results that were available during my care of the patient were reviewed by me and considered in my medical decision making (see chart for details).    Vitals:   09/19/17 1515 09/19/17 1530 09/19/17 1600 09/19/17 1602   BP:  124/74 117/76 117/76  Pulse: 88 81 93 89  Resp: 17 14 15 18   Temp:      TempSrc:      SpO2: 97% 99% 99% 99%  Weight:      Height:        Medications  capsaicin (ZOSTRIX) 0.025 % cream ( Topical Refused 09/19/17 1550)  haloperidol lactate (HALDOL) injection 5 mg (5 mg Intravenous Given 09/19/17 1441)  sodium chloride 0.9 % bolus 1,000 mL (0 mLs Intravenous Stopped 09/19/17 1611)    BERDINE RASMUSSON is 47 y.o. female presenting with nausea vomiting and abdominal pain.  Afebrile, nonfocal abdominal exam, states that this happens repeatedly.  Has had workup as an outpatient with did not eat which did not yield any diagnosis.  Blood work reassuring, urinalysis with 80 ketones, patient given fluid bolus, medicated.  She is tolerating p.o.'s, appropriate for discharge.  Evaluation does not show pathology that would require ongoing emergent intervention or inpatient treatment. Pt is hemodynamically stable and mentating appropriately. Discussed findings and plan with patient/guardian, who agrees with care plan. All questions answered. Return precautions discussed and outpatient follow up given.      Final Clinical Impressions(s) / ED Diagnoses   Final diagnoses:  Nausea and vomiting, intractability of vomiting not specified, unspecified vomiting type    ED Discharge Orders        Ordered    ondansetron (ZOFRAN) 4 MG tablet  Every 8 hours PRN     09/19/17 1558       Kamari Buch, Charna Elizabeth 09/19/17 1856    Mackuen, Fredia Sorrow, MD 09/26/17 0710

## 2017-09-19 NOTE — Discharge Instructions (Signed)
Push fluids: take small frequent sips of water or Gatorade, do not drink any soda, juice or caffeinated beverages.    Slowly resume solid diet as desired. Avoid food that are spicy, contain dairy and/or have high fat content.  Aviod NSAIDs (aspirin, motrin, ibuprofen, naproxen, Aleve et Ronney Asters) for pain control because they will irritate your stomach.  Return to the emergency room for severely worsening abdominal pain, abdominal pain that localizes to a particular area (especially the right lower part of the belly), pain that persists past 8-10 hours, blood in stool or vomit, severe weakness, fainting, or fever.   Maintain hydration by drinking small amounts of clear fluids frequently, then soft diet, and then advance to a solid diet as tolerated. Avoid foods that are spicy, high in fat or dairy.

## 2017-09-19 NOTE — ED Notes (Signed)
Pt reports she as able to take a few sips pf gingerale. Pt resting comfortably at this time, no emesis noted

## 2017-09-19 NOTE — ED Notes (Signed)
Upon entering patient's room, patient asleep. Pt ambulatory to restroom for UA.

## 2017-09-19 NOTE — ED Notes (Signed)
Pt given ginger ale and saltine crackers.  

## 2017-09-19 NOTE — ED Notes (Signed)
Pt reports she does not plan on using the capsaicin cream as "it burns like hell." When sent from pharmacy will ask if patient would like to try it.

## 2017-09-21 LAB — URINE CULTURE: Culture: NO GROWTH

## 2017-12-26 ENCOUNTER — Emergency Department (HOSPITAL_COMMUNITY)
Admission: EM | Admit: 2017-12-26 | Discharge: 2017-12-26 | Disposition: A | Discharge status: Home / Self Care | Payer: Medicaid - Out of State | Attending: Emergency Medicine | Admitting: Emergency Medicine

## 2017-12-26 ENCOUNTER — Encounter (HOSPITAL_COMMUNITY): Payer: Self-pay | Admitting: Emergency Medicine

## 2017-12-26 ENCOUNTER — Other Ambulatory Visit: Payer: Self-pay

## 2017-12-26 DIAGNOSIS — J45909 Unspecified asthma, uncomplicated: Secondary | ICD-10-CM | POA: Insufficient documentation

## 2017-12-26 DIAGNOSIS — Z79899 Other long term (current) drug therapy: Secondary | ICD-10-CM | POA: Diagnosis not present

## 2017-12-26 DIAGNOSIS — M545 Low back pain: Secondary | ICD-10-CM | POA: Diagnosis present

## 2017-12-26 DIAGNOSIS — M5441 Lumbago with sciatica, right side: Secondary | ICD-10-CM | POA: Diagnosis not present

## 2017-12-26 HISTORY — DX: Dorsalgia, unspecified: M54.9

## 2017-12-26 MED ORDER — HYDROMORPHONE HCL 1 MG/ML IJ SOLN
1.0000 mg | Freq: Once | INTRAMUSCULAR | Status: AC
  Administered 2017-12-26: 1 mg via INTRAMUSCULAR

## 2017-12-26 MED ORDER — HYDROMORPHONE HCL 1 MG/ML IJ SOLN
1.0000 mg | Freq: Once | INTRAMUSCULAR | Status: DC
  Filled 2017-12-26: qty 1

## 2017-12-26 MED ORDER — DIPHENHYDRAMINE HCL 25 MG PO CAPS
25.0000 mg | ORAL_CAPSULE | Freq: Once | ORAL | Status: AC
  Administered 2017-12-26: 25 mg via ORAL
  Filled 2017-12-26: qty 1

## 2017-12-26 NOTE — ED Notes (Signed)
Pt reports that she is in a pain clinic and has flare ups about once a year where she is unable to control her pain.

## 2017-12-26 NOTE — Discharge Instructions (Addendum)
Please read attached information. If you experience any new or worsening signs or symptoms please return to the emergency room for evaluation. Please follow-up with your primary care provider or specialist as discussed.  °

## 2017-12-26 NOTE — ED Triage Notes (Signed)
Pt. Stated, Im having bad lower back pain for years . I go to a pain clinic in [place].

## 2017-12-26 NOTE — ED Provider Notes (Signed)
[facility] DEPART[facility] Provider Note   CSN: [id number] Arrival date & time: 12/26/17  1202     History   Chief Complaint Chief Complaint  Patient presents with  . Back Pain    HPI Vickie Hernandez is a 47 y.o. female.  HPI     47 year old female presents today with complaints of back pain. Patient notes a significant past medical history of chronic back pain, currently seen in [place] at a pain management specialist. She notes taking 10 mg of oxycodone 3 times daily. She notes that occasionally she has flares of her lower back pain and recently had a flare over the last several days. Patient denies any loss of distal sensation strength or motor function, abdominal pain, fever or chills. Patient does note pain radiating down the posterior aspect of her leg. She notes is identical to previous. She notes taking her oxycodone this morning without significant improvement in her symptoms. She denies any trauma.pain is worse with movement and palpation.     Past Medical History:  Diagnosis Date  . Anemia   . Asthma   . Back pain     There are no active problems to display for this patient.   Past Surgical History:  Procedure Laterality Date  . CESAREAN SECTION    . FOOT SURGERY       OB History    Gravida  0   Para  0   Term  0   Preterm  0   AB  0   Living        SAB  0   TAB  0   Ectopic  0   Multiple      Live Births               Home Medications    Prior to Admission medications   Medication Sig Start Date End Date Taking? Authorizing Provider  albuterol (PROVENTIL HFA;VENTOLIN HFA) 108 (90 BASE) MCG/ACT inhaler Inhale 2 puffs into the lungs every 6 (six) hours as needed. Wheezing and shortness of breath    [provider]  albuterol (PROVENTIL) 2 MG tablet Take 1 tablet (2 mg total) by mouth 3 (three) times daily. 12/25/11 12/24/12  Blair Heys, MD  amitriptyline (ELAVIL) 50 MG tablet Take 50  mg by mouth at bedtime.    [provider]  ciprofloxacin (CIPRO) 500 MG tablet Take 1 tablet (500 mg total) by mouth 2 (two) times daily. Patient not taking: Reported on 04/24/2017 12/15/16   Ward, Delice Bison, DO  dicyclomine (BENTYL) 20 MG tablet Take 1 tablet (20 mg total) by mouth 2 (two) times daily. 04/24/17   Tanna Furry, MD  diphenoxylate-atropine (LOMOTIL) 2.5-0.025 MG tablet Take 1 tablet by mouth 4 (four) times daily as needed for diarrhea or loose stools. 04/24/17   Tanna Furry, MD  Fluticasone-Salmeterol (ADVAIR) 250-50 MCG/DOSE AEPB Inhale 1 puff into the lungs every 12 (twelve) hours.    [provider]  lubiprostone (AMITIZA) 24 MCG capsule Take 24 mcg by mouth 2 (two) times daily with a meal.    [provider]  metoCLOPramide (REGLAN) 5 MG tablet Take 5 mg by mouth 3 (three) times daily before meals.    [provider]  metroNIDAZOLE (FLAGYL) 500 MG tablet Take 1 tablet (500 mg total) by mouth 3 (three) times daily. Patient not taking: Reported on 04/24/2017 12/15/16   Ward, Delice Bison, DO  montelukast (SINGULAIR) 10 MG tablet Take 10 mg  by mouth at bedtime.    [provider]  omeprazole (PRILOSEC) 20 MG capsule Take 1 capsule (20 mg total) by mouth daily. Patient not taking: Reported on 04/24/2017 04/22/15   Mackuen, Courteney Lyn, MD  ondansetron (ZOFRAN ODT) 4 MG disintegrating tablet Take 1 tablet (4 mg total) by mouth every 8 (eight) hours as needed for nausea. 04/24/17   Tanna Furry, MD  ondansetron (ZOFRAN) 4 MG tablet Take 1 tablet (4 mg total) by mouth every 8 (eight) hours as needed for nausea or vomiting. 09/19/17   Pisciotta, Elmyra Ricks, PA-C  oxyCODONE-acetaminophen (PERCOCET/ROXICET) 5-325 MG tablet Take 1 tablet by mouth every 4 (four) hours as needed. 12/15/16   Ward, Delice Bison, DO  pantoprazole (PROTONIX) 40 MG tablet Take 40 mg by mouth daily.    [provider]  PARoxetine (PAXIL) 20 MG tablet Take 20 mg by mouth  daily.    [provider]  pregabalin (LYRICA) 75 MG capsule Take 75 mg by mouth 2 (two) times daily.    [provider]  promethazine (PHENERGAN) 25 MG tablet Take 1 tablet (25 mg total) by mouth every 6 (six) hours as needed for nausea or vomiting. Patient not taking: Reported on 04/24/2017 12/15/16   Ward, Delice Bison, DO  sucralfate (CARAFATE) 1 GM/10ML suspension Take 1 g by mouth 4 (four) times daily -  with meals and at bedtime.    [provider]  SUMAtriptan (IMITREX) 100 MG tablet Take 100 mg by mouth every 2 (two) hours as needed for migraine. May repeat in 2 hours if headache persists or recurs.    [provider]  tiZANidine (ZANAFLEX) 4 MG tablet Take 4 mg by mouth at bedtime.    [provider]  trazodone (DESYREL) 300 MG tablet Take 300 mg by mouth at bedtime.    [provider]    Family History No family history on file.  Social History Social History   Tobacco Use  . Smoking status: Never Smoker  . Smokeless tobacco: Never Used  Substance Use Topics  . Alcohol use: No  . Drug use: No     Allergies   Bee venom and Shellfish allergy   Review of Systems Review of Systems  All other systems reviewed and are negative.   Physical Exam Updated Vital Signs BP (!) 130/102 (BP Location: Right Arm)   Pulse 72   Temp 98.9 F (37.2 C) (Oral)   Resp 18   Ht 5\' 5"  (1.651 m)   Wt 83.9 kg (185 lb)   SpO2 100%   BMI 30.79 kg/m   Physical Exam  Constitutional: She is oriented to person, place, and time. She appears well-developed and well-nourished.  HENT:  Head: Normocephalic and atraumatic.  Eyes: Pupils are equal, round, and reactive to light. Conjunctivae are normal. Right eye exhibits no discharge. Left eye exhibits no discharge. No scleral icterus.  Neck: Normal range of motion. No JVD present. No tracheal deviation present.  Pulmonary/Chest: Effort normal. No stridor.  Musculoskeletal:  TTP of lumbar  region diffusely worse on the right side; straight leg positive, distal sensation strength or motor function intact no thoracic or lumbar spinal daughters palpation  Neurological: She is alert and oriented to person, place, and time. Coordination normal.  Psychiatric: She has a normal mood and affect. Her behavior is normal. Judgment and thought content normal.  Nursing note and vitals reviewed.    ED Treatments / Results  Labs (all labs ordered are listed, but only  abnormal results are displayed) Labs Reviewed - No data to display  EKG None  Radiology No results found.  Procedures Procedures (including critical care time)  Medications Ordered in ED Medications  HYDROmorphone (DILAUDID) injection 1 mg (1 mg Intramuscular Given 12/26/17 1235)  diphenhydrAMINE (BENADRYL) capsule 25 mg (25 mg Oral Given 12/26/17 1346)     Initial Impression / Assessment and Plan / ED Course  I have reviewed the triage vital signs and the nursing notes.  Pertinent labs & imaging results that were available during my care of the patient were reviewed by me and considered in my medical decision making (see chart for details).     Labs:   Imaging:  Consults:  Therapeutics: Dilaudid, Benadryl  Discharge Meds:   Assessment/Plan: 29 YOM presents today with uncomplicated lower back pain. This is an acute exacerbation of her chronic pain. She'll be treated with pain medicine here discharged to outpatient follow-up instructor percussions. Patient verbalized understanding and agreement to today's plan had no further questions or concerns. Patient had no red flags on her exam today.  Final Clinical Impressions(s) / ED Diagnoses   Final diagnoses:  Midline low back pain with right-sided sciatica, unspecified chronicity    ED Discharge Orders    None       Francee Gentile 12/26/17 1442    Hayden Rasmussen, MD 12/27/17 [phone]

## 2018-04-23 ENCOUNTER — Emergency Department (HOSPITAL_COMMUNITY)
Admission: EM | Admit: 2018-04-23 | Discharge: 2018-04-24 | Disposition: A | Discharge status: Home / Self Care | Payer: Medicaid - Out of State | Attending: Emergency Medicine | Admitting: Emergency Medicine

## 2018-04-23 ENCOUNTER — Encounter (HOSPITAL_COMMUNITY): Payer: Self-pay | Admitting: Emergency Medicine

## 2018-04-23 ENCOUNTER — Other Ambulatory Visit: Payer: Self-pay

## 2018-04-23 DIAGNOSIS — R1084 Generalized abdominal pain: Secondary | ICD-10-CM | POA: Diagnosis not present

## 2018-04-23 DIAGNOSIS — R112 Nausea with vomiting, unspecified: Secondary | ICD-10-CM

## 2018-04-23 LAB — COMPREHENSIVE METABOLIC PANEL
ALT: 11 U/L (ref 0–44)
ANION GAP: 12 (ref 5–15)
AST: 16 U/L (ref 15–41)
Albumin: 4.4 g/dL (ref 3.5–5.0)
Alkaline Phosphatase: 58 U/L (ref 38–126)
BUN: 10 mg/dL (ref 6–20)
CALCIUM: 9.7 mg/dL (ref 8.9–10.3)
CO2: 17 mmol/L — ABNORMAL LOW (ref 22–32)
Chloride: 109 mmol/L (ref 98–111)
Creatinine, Ser: 0.75 mg/dL (ref 0.44–1.00)
GFR calc non Af Amer: >60 mL/min (ref >60)
Glucose, Bld: 147 mg/dL — ABNORMAL HIGH (ref 70–99)
Potassium: 3.5 mmol/L (ref 3.5–5.1)
SODIUM: 138 mmol/L (ref 135–145)
TOTAL PROTEIN: 8 g/dL (ref 6.5–8.1)
Total Bilirubin: 1 mg/dL (ref 0.3–1.2)

## 2018-04-23 LAB — LIPASE, BLOOD: LIPASE: 20 U/L (ref 11–51)

## 2018-04-23 LAB — CBC
HCT: 36.9 % (ref 36.0–46.0)
Hemoglobin: 11.8 g/dL — ABNORMAL LOW (ref 12.0–15.0)
MCH: 30.2 pg (ref 26.0–34.0)
MCHC: 32 g/dL (ref 30.0–36.0)
MCV: 94.4 fL (ref 80.0–100.0)
NRBC: 0 % (ref 0.0–0.2)
PLATELETS: 238 10*3/uL (ref 150–400)
RBC: 3.91 MIL/uL (ref 3.87–5.11)
RDW: 13.1 % (ref 11.5–15.5)
WBC: 6.4 10*3/uL (ref 4.0–10.5)

## 2018-04-23 LAB — I-STAT BETA HCG BLOOD, ED (MC, WL, AP ONLY)

## 2018-04-23 MED ORDER — SODIUM CHLORIDE 0.9 % IV BOLUS
1000.0000 mL | Freq: Once | INTRAVENOUS | Status: AC
  Administered 2018-04-23: 1000 mL via INTRAVENOUS

## 2018-04-23 MED ORDER — METOCLOPRAMIDE HCL 5 MG/ML IJ SOLN
10.0000 mg | Freq: Once | INTRAMUSCULAR | Status: AC
  Administered 2018-04-23: 10 mg via INTRAVENOUS
  Filled 2018-04-23: qty 2

## 2018-04-23 MED ORDER — KETOROLAC TROMETHAMINE 30 MG/ML IJ SOLN
30.0000 mg | Freq: Once | INTRAMUSCULAR | Status: AC
  Administered 2018-04-23: 30 mg via INTRAVENOUS
  Filled 2018-04-23: qty 1

## 2018-04-23 MED ORDER — ONDANSETRON HCL 4 MG/2ML IJ SOLN
4.0000 mg | Freq: Once | INTRAMUSCULAR | Status: AC
  Administered 2018-04-23: 4 mg via INTRAVENOUS
  Filled 2018-04-23: qty 2

## 2018-04-23 NOTE — ED Notes (Signed)
Pt complains of generalized abd pains with nausea and vomiting x1 day.

## 2018-04-23 NOTE — ED Notes (Signed)
Pt reports that she is wearing a "nausea medication patch" at this time.

## 2018-04-23 NOTE — ED Triage Notes (Signed)
Pt reports nausea/vominting since last night. Denies diarrhea. States she has abd pain directly in the middle of her abd.

## 2018-04-23 NOTE — ED Provider Notes (Signed)
[facility] EMERGENCY DEPARTMENT Provider Note   CSN: [id number] Arrival date & time: 04/23/18  1911     History   Chief Complaint Chief Complaint  Patient presents with  . Emesis  . Abdominal Pain    HPI Vickie Hernandez is a 47 y.o. female.  Patient is a 47 year old female with past medical history of chronic abdominal pain, nausea, and vomiting.  She has experienced episodes of this over the past several years.  She lives primarily in [place], however spends a good deal of time here in [place].  She has a GI doctor in [place] who follows her for this.  No definitive cause has been found.  She tells me they "just treat the episodes when they occur".  She denies any bloody vomit or stool.  She denies any fevers, chills, or diarrhea.  The history is provided by the patient.  Emesis   This is a recurrent problem. The current episode started yesterday. The problem occurs continuously. The problem has not changed since onset.There has been no fever.    Past Medical History:  Diagnosis Date  . Anemia   . Asthma   . Back pain     There are no active problems to display for this patient.   Past Surgical History:  Procedure Laterality Date  . CESAREAN SECTION    . FOOT SURGERY       OB History    Gravida  0   Para  0   Term  0   Preterm  0   AB  0   Living        SAB  0   TAB  0   Ectopic  0   Multiple      Live Births               Home Medications    Prior to Admission medications   Medication Sig Start Date End Date Taking? Authorizing Provider  albuterol (PROVENTIL HFA;VENTOLIN HFA) 108 (90 BASE) MCG/ACT inhaler Inhale 2 puffs into the lungs every 6 (six) hours as needed. Wheezing and shortness of breath    [provider]  albuterol (PROVENTIL) 2 MG tablet Take 1 tablet (2 mg total) by mouth 3 (three) times daily. 12/25/11 12/24/12  Blair Heys, MD  amitriptyline (ELAVIL) 50 MG tablet Take 50 mg by mouth at  bedtime.    [provider]  ciprofloxacin (CIPRO) 500 MG tablet Take 1 tablet (500 mg total) by mouth 2 (two) times daily. Patient not taking: Reported on 04/24/2017 12/15/16   Ward, Delice Bison, DO  dicyclomine (BENTYL) 20 MG tablet Take 1 tablet (20 mg total) by mouth 2 (two) times daily. 04/24/17   Tanna Furry, MD  diphenoxylate-atropine (LOMOTIL) 2.5-0.025 MG tablet Take 1 tablet by mouth 4 (four) times daily as needed for diarrhea or loose stools. 04/24/17   Tanna Furry, MD  Fluticasone-Salmeterol (ADVAIR) 250-50 MCG/DOSE AEPB Inhale 1 puff into the lungs every 12 (twelve) hours.    [provider]  lubiprostone (AMITIZA) 24 MCG capsule Take 24 mcg by mouth 2 (two) times daily with a meal.    [provider]  metoCLOPramide (REGLAN) 5 MG tablet Take 5 mg by mouth 3 (three) times daily before meals.    [provider]  metroNIDAZOLE (FLAGYL) 500 MG tablet Take 1 tablet (500 mg total) by mouth 3 (three) times daily. Patient not taking: Reported on 04/24/2017 12/15/16   Ward, Delice Bison, DO  montelukast (  SINGULAIR) 10 MG tablet Take 10 mg by mouth at bedtime.    [provider]  omeprazole (PRILOSEC) 20 MG capsule Take 1 capsule (20 mg total) by mouth daily. Patient not taking: Reported on 04/24/2017 04/22/15   Mackuen, Courteney Lyn, MD  ondansetron (ZOFRAN ODT) 4 MG disintegrating tablet Take 1 tablet (4 mg total) by mouth every 8 (eight) hours as needed for nausea. 04/24/17   Tanna Furry, MD  ondansetron (ZOFRAN) 4 MG tablet Take 1 tablet (4 mg total) by mouth every 8 (eight) hours as needed for nausea or vomiting. 09/19/17   Pisciotta, Elmyra Ricks, PA-C  oxyCODONE-acetaminophen (PERCOCET/ROXICET) 5-325 MG tablet Take 1 tablet by mouth every 4 (four) hours as needed. 12/15/16   Ward, Delice Bison, DO  pantoprazole (PROTONIX) 40 MG tablet Take 40 mg by mouth daily.    [provider]  PARoxetine (PAXIL) 20 MG tablet Take 20 mg by mouth daily.    [provider]  pregabalin (LYRICA) 75 MG capsule Take 75 mg by mouth 2 (two) times daily.    [provider]  promethazine (PHENERGAN) 25 MG tablet Take 1 tablet (25 mg total) by mouth every 6 (six) hours as needed for nausea or vomiting. Patient not taking: Reported on 04/24/2017 12/15/16   Ward, Delice Bison, DO  sucralfate (CARAFATE) 1 GM/10ML suspension Take 1 g by mouth 4 (four) times daily -  with meals and at bedtime.    [provider]  SUMAtriptan (IMITREX) 100 MG tablet Take 100 mg by mouth every 2 (two) hours as needed for migraine. May repeat in 2 hours if headache persists or recurs.    [provider]  tiZANidine (ZANAFLEX) 4 MG tablet Take 4 mg by mouth at bedtime.    [provider]  trazodone (DESYREL) 300 MG tablet Take 300 mg by mouth at bedtime.    [provider]    Family History No family history on file.  Social History Social History   Tobacco Use  . Smoking status: Never Smoker  . Smokeless tobacco: Never Used  Substance Use Topics  . Alcohol use: No  . Drug use: No     Allergies   Bee venom and Shellfish allergy   Review of Systems Review of Systems  All other systems reviewed and are negative.    Physical Exam Updated Vital Signs BP (!) 145/94 (BP Location: Right Arm)   Pulse 65   Temp 98.9 F (37.2 C) (Oral)   Resp 20   SpO2 100%   Physical Exam  Constitutional: She is oriented to person, place, and time. She appears well-developed and well-nourished. No distress.  HENT:  Head: Normocephalic and atraumatic.  Neck: Normal range of motion. Neck supple.  Cardiovascular: Normal rate and regular rhythm. Exam reveals no gallop and no friction rub.  No murmur heard. Pulmonary/Chest: Effort normal and breath sounds normal. No respiratory distress. She has no wheezes.  Abdominal: Soft. Bowel sounds are normal. She exhibits no distension. There is tenderness in the epigastric area and periumbilical area.  There is no rigidity, no rebound and no guarding.  Musculoskeletal: Normal range of motion.  Neurological: She is alert and oriented to person, place, and time.  Skin: Skin is warm and dry. She is not diaphoretic.  Nursing note and vitals reviewed.    ED Treatments / Results  Labs (all labs ordered are listed, but only abnormal results are displayed) Labs Reviewed  COMPREHENSIVE METABOLIC PANEL - Abnormal; Notable for the following  components:      Result Value   CO2 17 (*)    Glucose, Bld 147 (*)    All other components within normal limits  CBC - Abnormal; Notable for the following components:   Hemoglobin 11.8 (*)    All other components within normal limits  LIPASE, BLOOD  URINALYSIS, ROUTINE W REFLEX MICROSCOPIC  I-STAT BETA HCG BLOOD, ED ([facility], [facility], [facility] ONLY)    EKG None  Radiology No results found.  Procedures Procedures (including critical care time)  Medications Ordered in ED Medications  ondansetron (ZOFRAN) injection 4 mg (has no administration in time range)  ketorolac (TORADOL) 30 MG/ML injection 30 mg (has no administration in time range)  sodium chloride 0.9 % bolus 1,000 mL (has no administration in time range)  metoCLOPramide (REGLAN) injection 10 mg (has no administration in time range)     Initial Impression / Assessment and Plan / ED Course  I have reviewed the triage vital signs and the nursing notes.  Pertinent labs & imaging results that were available during my care of the patient were reviewed by me and considered in my medical decision making (see chart for details).  Patient with history of recurrent nausea and vomiting that has been unexplained.  This is been an ongoing issue for her for many years.  Her abdominal exam is benign and laboratory studies are reassuring.  She was given normal saline x2 L along with medicine for nausea and pain.  She is now feeling better and I believe appropriate for discharge.  Abdomen remains benign upon  reexamination.  To return as needed for any problems.  Final Clinical Impressions(s) / ED Diagnoses   Final diagnoses:  None    ED Discharge Orders    None       Veryl Speak, MD 04/24/18 [phone]

## 2018-04-23 NOTE — ED Provider Notes (Signed)
Patient placed in Quick Look pathway, seen and evaluated   Chief Complaint: Abdominal pain  HPI:   Pt complains of abdominal pain.  Pt reports she has had nausea and vomiting   ROS: no diarrhea   Physical Exam:   Gen: No distress  Neuro: Awake and Alert  Skin: Warm    Focused Exam: resp normal, Heart rate normal    Initiation of care has begun. The patient has been counseled on the process, plan, and necessity for staying for the completion/evaluation, and the remainder of the medical screening examination   Fransico Meadow, PA-C 04/23/18 [place], [place], DO 04/24/18 0045

## 2018-04-24 MED ORDER — PROMETHAZINE HCL 25 MG/ML IJ SOLN
12.5000 mg | Freq: Once | INTRAMUSCULAR | Status: AC
  Administered 2018-04-24: 12.5 mg via INTRAVENOUS
  Filled 2018-04-24: qty 1

## 2018-04-24 NOTE — Discharge Instructions (Addendum)
Continue your medications as previously prescribed and follow-up with your gastroenterologist if symptoms recur.

## 2018-04-24 NOTE — ED Notes (Signed)
Patient verbalizes understanding of discharge instructions. Opportunity for questioning and answers were provided. Armband removed by staff, pt discharged from ED home via POV.  

## 2018-09-01 ENCOUNTER — Encounter (HOSPITAL_COMMUNITY): Payer: Self-pay | Admitting: Emergency Medicine

## 2018-09-01 ENCOUNTER — Other Ambulatory Visit: Payer: Self-pay

## 2018-09-01 ENCOUNTER — Emergency Department (HOSPITAL_COMMUNITY): Payer: Medicaid - Out of State

## 2018-09-01 ENCOUNTER — Emergency Department (HOSPITAL_COMMUNITY)
Admission: EM | Admit: 2018-09-01 | Discharge: 2018-09-01 | Disposition: A | Discharge status: Home / Self Care | Payer: Medicaid - Out of State | Attending: Emergency Medicine | Admitting: Emergency Medicine

## 2018-09-01 DIAGNOSIS — R109 Unspecified abdominal pain: Secondary | ICD-10-CM | POA: Diagnosis not present

## 2018-09-01 DIAGNOSIS — J45909 Unspecified asthma, uncomplicated: Secondary | ICD-10-CM | POA: Insufficient documentation

## 2018-09-01 DIAGNOSIS — R112 Nausea with vomiting, unspecified: Secondary | ICD-10-CM | POA: Insufficient documentation

## 2018-09-01 DIAGNOSIS — E860 Dehydration: Secondary | ICD-10-CM

## 2018-09-01 LAB — COMPREHENSIVE METABOLIC PANEL
ALT: 15 U/L (ref 0–44)
AST: 40 U/L (ref 15–41)
Albumin: 4.7 g/dL (ref 3.5–5.0)
Alkaline Phosphatase: 62 U/L (ref 38–126)
Anion gap: 13 (ref 5–15)
BUN: 12 mg/dL (ref 6–20)
CHLORIDE: 106 mmol/L (ref 98–111)
CO2: 18 mmol/L — AB (ref 22–32)
Calcium: 10.1 mg/dL (ref 8.9–10.3)
Creatinine, Ser: 0.73 mg/dL (ref 0.44–1.00)
GFR calc non Af Amer: >60 mL/min (ref >60)
GLUCOSE: 147 mg/dL — AB (ref 70–99)
POTASSIUM: 4.8 mmol/L (ref 3.5–5.1)
Sodium: 137 mmol/L (ref 135–145)
Total Bilirubin: 1.1 mg/dL (ref 0.3–1.2)
Total Protein: 8.2 g/dL — ABNORMAL HIGH (ref 6.5–8.1)

## 2018-09-01 LAB — CBC WITH DIFFERENTIAL/PLATELET
Abs Immature Granulocytes: 0.03 10*3/uL (ref 0.00–0.07)
BASOS PCT: 0 %
Basophils Absolute: 0 10*3/uL (ref 0.0–0.1)
EOS ABS: 0.2 10*3/uL (ref 0.0–0.5)
EOS PCT: 3 %
HCT: 37.2 % (ref 36.0–46.0)
Hemoglobin: 12.2 g/dL (ref 12.0–15.0)
Immature Granulocytes: 0 %
Lymphocytes Relative: 22 %
Lymphs Abs: 1.6 10*3/uL (ref 0.7–4.0)
MCH: 31.4 pg (ref 26.0–34.0)
MCHC: 32.8 g/dL (ref 30.0–36.0)
MCV: 95.6 fL (ref 80.0–100.0)
MONO ABS: 0.2 10*3/uL (ref 0.1–1.0)
MONOS PCT: 3 %
Neutro Abs: 5.1 10*3/uL (ref 1.7–7.7)
Neutrophils Relative %: 72 %
PLATELETS: 227 10*3/uL (ref 150–400)
RBC: 3.89 MIL/uL (ref 3.87–5.11)
RDW: 13.9 % (ref 11.5–15.5)
WBC: 7.1 10*3/uL (ref 4.0–10.5)
nRBC: 0 % (ref 0.0–0.2)

## 2018-09-01 LAB — LIPASE, BLOOD: Lipase: 16 U/L (ref 11–51)

## 2018-09-01 MED ORDER — DIPHENHYDRAMINE HCL 25 MG PO CAPS
25.0000 mg | ORAL_CAPSULE | Freq: Once | ORAL | Status: AC
  Administered 2018-09-01: 25 mg via ORAL
  Filled 2018-09-01: qty 1

## 2018-09-01 MED ORDER — METOCLOPRAMIDE HCL 5 MG/ML IJ SOLN: 10.0000 mg | Freq: Once | INTRAMUSCULAR | Status: DC

## 2018-09-01 MED ORDER — ONDANSETRON HCL 4 MG/2ML IJ SOLN
4.0000 mg | Freq: Once | INTRAMUSCULAR | Status: AC
  Administered 2018-09-01: 4 mg via INTRAVENOUS
  Filled 2018-09-01: qty 2

## 2018-09-01 MED ORDER — SODIUM CHLORIDE 0.9 % IV BOLUS
1000.0000 mL | Freq: Once | INTRAVENOUS | Status: AC
  Administered 2018-09-01: 1000 mL via INTRAVENOUS

## 2018-09-01 MED ORDER — DROPERIDOL 2.5 MG/ML IJ SOLN
2.5000 mg | Freq: Once | INTRAMUSCULAR | Status: AC
  Administered 2018-09-01: 2.5 mg via INTRAVENOUS
  Filled 2018-09-01: qty 1

## 2018-09-01 MED ORDER — MORPHINE SULFATE (PF) 4 MG/ML IV SOLN
4.0000 mg | Freq: Once | INTRAVENOUS | Status: AC
  Administered 2018-09-01: 4 mg via INTRAVENOUS
  Filled 2018-09-01: qty 1

## 2018-09-01 NOTE — ED Notes (Signed)
Pt aware we need a urine specimen. 

## 2018-09-01 NOTE — Discharge Instructions (Signed)
Follow-up with your primary care physician and your gastroenterologist when you return to [facility]

## 2018-09-01 NOTE — ED Triage Notes (Signed)
Patient c/o generalized abdominal pain with nausea and vomiting onset of last night. Denies any urinary issues.

## 2018-09-01 NOTE — ED Notes (Signed)
Pt transported to XR.  

## 2018-09-01 NOTE — ED Notes (Signed)
Discharge instructions (including medications) discussed with and copy provided to patient/caregiver.  Pt stable, ambulatory, and verbalizes understanding of d/c instructions. Pt's daughter will transport pt home.

## 2018-09-01 NOTE — ED Provider Notes (Signed)
[facility] EMERGENCY DEPARTMENT Provider Note   CSN: [id number] Arrival date & time: 09/01/18  9563    History   Chief Complaint Chief Complaint  Patient presents with  . Abdominal Pain  . Emesis    HPI Vickie Hernandez is a 48 y.o. female.     HPI Patient is a 48 year old female with a history of recurrent abdominal pain and recurrent nausea vomiting.  She resides in [place] but frequently visits [place].  She states that her abdominal pain nausea and vomiting began last night.  No hematemesis.  No diarrhea.  No lower abdominal pain.  No urinary frequency.  No longer has menstrual cycles.  No fevers or chills.  Symptoms are moderate in severity   Past Medical History:  Diagnosis Date  . Anemia   . Asthma   . Back pain     There are no active problems to display for this patient.   Past Surgical History:  Procedure Laterality Date  . CESAREAN SECTION    . FOOT SURGERY       OB History    Gravida  0   Para  0   Term  0   Preterm  0   AB  0   Living        SAB  0   TAB  0   Ectopic  0   Multiple      Live Births               Home Medications    Prior to Admission medications   Medication Sig Start Date End Date Taking? Authorizing Provider  albuterol (PROVENTIL) 2 MG tablet Take 1 tablet (2 mg total) by mouth 3 (three) times daily. Patient not taking: Reported on 09/01/2018 12/25/11 12/24/12  Blair Heys, MD  ciprofloxacin (CIPRO) 500 MG tablet Take 1 tablet (500 mg total) by mouth 2 (two) times daily. Patient not taking: Reported on 04/24/2017 12/15/16   Ward, Delice Bison, DO  dicyclomine (BENTYL) 20 MG tablet Take 1 tablet (20 mg total) by mouth 2 (two) times daily. Patient not taking: Reported on 09/01/2018 04/24/17   Tanna Furry, MD  diphenoxylate-atropine (LOMOTIL) 2.5-0.025 MG tablet Take 1 tablet by mouth 4 (four) times daily as needed for diarrhea or loose stools. Patient not taking: Reported on 09/01/2018 04/24/17    Tanna Furry, MD  metroNIDAZOLE (FLAGYL) 500 MG tablet Take 1 tablet (500 mg total) by mouth 3 (three) times daily. Patient not taking: Reported on 04/24/2017 12/15/16   Ward, Delice Bison, DO  omeprazole (PRILOSEC) 20 MG capsule Take 1 capsule (20 mg total) by mouth daily. Patient not taking: Reported on 04/24/2017 04/22/15   Mackuen, Courteney Lyn, MD  ondansetron (ZOFRAN ODT) 4 MG disintegrating tablet Take 1 tablet (4 mg total) by mouth every 8 (eight) hours as needed for nausea. Patient not taking: Reported on 09/01/2018 04/24/17   Tanna Furry, MD  ondansetron (ZOFRAN) 4 MG tablet Take 1 tablet (4 mg total) by mouth every 8 (eight) hours as needed for nausea or vomiting. Patient not taking: Reported on 09/01/2018 09/19/17   Pisciotta, Elmyra Ricks, PA-C  oxyCODONE-acetaminophen (PERCOCET/ROXICET) 5-325 MG tablet Take 1 tablet by mouth every 4 (four) hours as needed. Patient not taking: Reported on 09/01/2018 12/15/16   Ward, Delice Bison, DO  promethazine (PHENERGAN) 25 MG tablet Take 1 tablet (25 mg total) by mouth every 6 (six) hours as needed for nausea or vomiting. Patient not taking: Reported on 04/24/2017 12/15/16  Ward, Delice Bison, DO    Family History No family history on file.  Social History Social History   Tobacco Use  . Smoking status: Never Smoker  . Smokeless tobacco: Never Used  Substance Use Topics  . Alcohol use: No  . Drug use: No     Allergies   Bee venom; Iodinated diagnostic agents; Other; and Shellfish allergy   Review of Systems Review of Systems  All other systems reviewed and are negative.    Physical Exam Updated Vital Signs BP 105/78   Pulse 74   Resp 18   Ht 5\' 5"  (1.651 m)   Wt 68 kg   SpO2 96%   BMI 24.96 kg/m   Physical Exam Vitals signs and nursing note reviewed.  Constitutional:      General: She is not in acute distress.    Appearance: She is well-developed.  HENT:     Head: Normocephalic and atraumatic.  Neck:     Musculoskeletal: Normal  range of motion.  Cardiovascular:     Rate and Rhythm: Normal rate and regular rhythm.     Heart sounds: Normal heart sounds.  Pulmonary:     Effort: Pulmonary effort is normal.     Breath sounds: Normal breath sounds.  Abdominal:     General: There is no distension.     Palpations: Abdomen is soft.     Tenderness: There is no abdominal tenderness.  Musculoskeletal: Normal range of motion.  Skin:    General: Skin is warm and dry.  Neurological:     Mental Status: She is alert and oriented to person, place, and time.  Psychiatric:        Judgment: Judgment normal.      ED Treatments / Results  Labs (all labs ordered are listed, but only abnormal results are displayed) Labs Reviewed  COMPREHENSIVE METABOLIC PANEL - Abnormal; Notable for the following components:      Result Value   CO2 18 (*)    Glucose, Bld 147 (*)    Total Protein 8.2 (*)    All other components within normal limits  CBC WITH DIFFERENTIAL/PLATELET  LIPASE, BLOOD  URINALYSIS, ROUTINE W REFLEX MICROSCOPIC    EKG None  Radiology Dg Abd 2 Views  Result Date: 09/01/2018 CLINICAL DATA:  Generalized abdominal pain with nausea and vomiting since last night. EXAM: ABDOMEN - 2 VIEW COMPARISON:  Abdominopelvic CT 12/15/2016. FINDINGS: The bowel gas pattern is normal. There is mildly prominent stool in the rectum. No evidence of bowel wall thickening or free intraperitoneal air. Intrauterine device and calcified uterine fibroid are noted. There is mild lower lumbar spondylosis. IMPRESSION: No acute abdominal findings.  Mildly prominent stool in the rectum. Electronically Signed   By: Richardean Sale M.D.   On: 09/01/2018 10:31    Procedures Procedures (including critical care time)  Medications Ordered in ED Medications  ondansetron (ZOFRAN) injection 4 mg (4 mg Intravenous Given 09/01/18 0918)  sodium chloride 0.9 % bolus 1,000 mL (1,000 mLs Intravenous New Bag/Given 09/01/18 1019)  droperidol (INAPSINE) 2.5  MG/ML injection 2.5 mg (2.5 mg Intravenous Given 09/01/18 1141)  morphine 4 MG/ML injection 4 mg (4 mg Intravenous Given 09/01/18 1131)  diphenhydrAMINE (BENADRYL) capsule 25 mg (25 mg Oral Given 09/01/18 1148)     Initial Impression / Assessment and Plan / ED Course  I have reviewed the triage vital signs and the nursing notes.  Pertinent labs & imaging results that were available during my care of the patient  were reviewed by me and considered in my medical decision making (see chart for details).        12:44 PM Patient feels much better at this time.  Patient has nausea medication at home.  Repeat abdominal exam without focal tenderness.  Likely gastroparesis.  She will follow-up with her team in [place] when she returns.  She understands return to the ER for new or worsening symptoms  Final Clinical Impressions(s) / ED Diagnoses   Final diagnoses:  Nausea & vomiting  Nausea and vomiting, intractability of vomiting not specified, unspecified vomiting type  Acute dehydration  Abdominal pain, unspecified abdominal location    ED Discharge Orders    None       Jola Schmidt, MD 09/01/18 1245

## 2019-04-23 ENCOUNTER — Other Ambulatory Visit: Payer: Self-pay

## 2019-04-23 ENCOUNTER — Encounter (HOSPITAL_COMMUNITY): Payer: Self-pay

## 2019-04-23 ENCOUNTER — Emergency Department (HOSPITAL_COMMUNITY)
Admission: EM | Admit: 2019-04-23 | Discharge: 2019-04-24 | Disposition: A | Discharge status: Home / Self Care | Payer: Medicaid - Out of State | Attending: Emergency Medicine | Admitting: Emergency Medicine

## 2019-04-23 DIAGNOSIS — R1033 Periumbilical pain: Secondary | ICD-10-CM

## 2019-04-23 DIAGNOSIS — Z79899 Other long term (current) drug therapy: Secondary | ICD-10-CM | POA: Insufficient documentation

## 2019-04-23 DIAGNOSIS — R1115 Cyclical vomiting syndrome unrelated to migraine: Secondary | ICD-10-CM

## 2019-04-23 DIAGNOSIS — J45909 Unspecified asthma, uncomplicated: Secondary | ICD-10-CM | POA: Insufficient documentation

## 2019-04-23 LAB — CBC
HCT: 36.5 % (ref 36.0–46.0)
Hemoglobin: 12 g/dL (ref 12.0–15.0)
MCH: 31.9 pg (ref 26.0–34.0)
MCHC: 32.9 g/dL (ref 30.0–36.0)
MCV: 97.1 fL (ref 80.0–100.0)
Platelets: 221 10*3/uL (ref 150–400)
RBC: 3.76 MIL/uL — ABNORMAL LOW (ref 3.87–5.11)
RDW: 13.3 % (ref 11.5–15.5)
WBC: 5.8 10*3/uL (ref 4.0–10.5)
nRBC: 0 % (ref 0.0–0.2)

## 2019-04-23 LAB — COMPREHENSIVE METABOLIC PANEL
ALT: 12 U/L (ref 0–44)
AST: 16 U/L (ref 15–41)
Albumin: 4.3 g/dL (ref 3.5–5.0)
Alkaline Phosphatase: 65 U/L (ref 38–126)
Anion gap: 13 (ref 5–15)
BUN: 7 mg/dL (ref 6–20)
CO2: 23 mmol/L (ref 22–32)
Calcium: 9.8 mg/dL (ref 8.9–10.3)
Chloride: 101 mmol/L (ref 98–111)
Creatinine, Ser: 0.65 mg/dL (ref 0.44–1.00)
GFR calc Af Amer: >60 mL/min (ref >60)
GFR calc non Af Amer: >60 mL/min (ref >60)
Glucose, Bld: 132 mg/dL — ABNORMAL HIGH (ref 70–99)
Potassium: 4.1 mmol/L (ref 3.5–5.1)
Sodium: 137 mmol/L (ref 135–145)
Total Bilirubin: 0.4 mg/dL (ref 0.3–1.2)
Total Protein: 7.7 g/dL (ref 6.5–8.1)

## 2019-04-23 LAB — I-STAT BETA HCG BLOOD, ED (MC, WL, AP ONLY): I-stat hCG, quantitative: <5 m[IU]/mL (ref <5)

## 2019-04-23 LAB — LIPASE, BLOOD: Lipase: 21 U/L (ref 11–51)

## 2019-04-23 MED ORDER — ONDANSETRON 4 MG PO TBDP
4.0000 mg | ORAL_TABLET | Freq: Once | ORAL | Status: AC | PRN
  Administered 2019-04-23: 4 mg via ORAL
  Filled 2019-04-23: qty 1

## 2019-04-23 MED ORDER — SODIUM CHLORIDE 0.9% FLUSH: 3.0000 mL | Freq: Once | INTRAVENOUS | Status: DC

## 2019-04-23 NOTE — ED Notes (Signed)
Pt called x3 in the waiting room. No reply. 

## 2019-04-23 NOTE — ED Triage Notes (Signed)
Pt reports mid abd pain, n/v for the past few days. Pt vomited multiple times today. Last BM this morning. Pt a.o, nad noted.

## 2019-04-24 LAB — URINALYSIS, ROUTINE W REFLEX MICROSCOPIC
Bacteria, UA: NONE SEEN
Bilirubin Urine: NEGATIVE
Glucose, UA: NEGATIVE mg/dL
Ketones, ur: 20 mg/dL — AB
Leukocytes,Ua: NEGATIVE
Nitrite: NEGATIVE
Protein, ur: NEGATIVE mg/dL
Specific Gravity, Urine: 1.012 (ref 1.005–1.030)
pH: 6 (ref 5.0–8.0)

## 2019-04-24 MED ORDER — LIDOCAINE VISCOUS HCL 2 % MT SOLN
15.0000 mL | Freq: Once | OROMUCOSAL | Status: AC
  Administered 2019-04-24: 15 mL via ORAL
  Filled 2019-04-24: qty 15

## 2019-04-24 MED ORDER — DICYCLOMINE HCL 20 MG PO TABS: 20.0000 mg | ORAL_TABLET | Freq: Two times a day (BID) | ORAL | 0 refills | Status: DC

## 2019-04-24 MED ORDER — ALUM & MAG HYDROXIDE-SIMETH 200-200-20 MG/5ML PO SUSP
30.0000 mL | Freq: Once | ORAL | Status: AC
  Administered 2019-04-24: 30 mL via ORAL
  Filled 2019-04-24: qty 30

## 2019-04-24 NOTE — ED Provider Notes (Signed)
[facility] EMERGENCY DEPARTMENT Provider Note  CSN: [id number] Arrival date & time: 04/23/19 1735  Chief Complaint(s) Abdominal Pain and Emesis  HPI Vickie Hernandez is a 48 y.o. female   The history is provided by the patient.  Abdominal Pain Pain location:  Periumbilical Pain radiates to:  Does not radiate Pain severity:  Moderate Onset quality:  Gradual Duration:  2 days Timing:  Constant Progression:  Waxing and waning Chronicity:  Recurrent Context comment:  Related to cyclical vomiting Relieved by:  Nothing Worsened by:  Vomiting Associated symptoms: nausea and vomiting (NBNB)   Associated symptoms: no constipation, no diarrhea and no fever   Emesis Associated symptoms: abdominal pain   Associated symptoms: no diarrhea and no fever     Past Medical History Past Medical History:  Diagnosis Date  . Anemia   . Asthma   . Back pain    There are no active problems to display for this patient.  Home Medication(s) Prior to Admission medications   Medication Sig Start Date End Date Taking? Authorizing Provider  DULoxetine (CYMBALTA) 30 MG capsule Take 30 mg by mouth daily. 04/16/19  Yes [provider]  escitalopram (LEXAPRO) 10 MG tablet Take 10 mg by mouth daily. 04/16/19  Yes [provider]  hydrOXYzine (VISTARIL) 25 MG capsule Take 25 mg by mouth at bedtime. 04/16/19  Yes [provider]  metoCLOPramide (REGLAN) 10 MG tablet Take 10 mg by mouth 3 (three) times daily. 03/06/19  Yes [provider]  Oxycodone HCl 10 MG TABS Take 10 mg by mouth 4 (four) times daily as needed (for pain).  04/20/19  Yes [provider]  albuterol (PROVENTIL) 2 MG tablet Take 1 tablet (2 mg total) by mouth 3 (three) times daily. Patient not taking: Reported on 04/24/2019 12/25/11 04/23/28  Blair Heys, MD  ciprofloxacin (CIPRO) 500 MG tablet Take 1 tablet (500 mg total) by mouth 2 (two) times daily. Patient not taking:  Reported on 04/24/2017 12/15/16   Ward, Delice Bison, DO  dicyclomine (BENTYL) 20 MG tablet Take 1 tablet (20 mg total) by mouth 2 (two) times daily for 5 days. 04/24/19 04/29/19  Fatima Blank, MD  diphenoxylate-atropine (LOMOTIL) 2.5-0.025 MG tablet Take 1 tablet by mouth 4 (four) times daily as needed for diarrhea or loose stools. Patient not taking: Reported on 09/01/2018 04/24/17   Tanna Furry, MD  metroNIDAZOLE (FLAGYL) 500 MG tablet Take 1 tablet (500 mg total) by mouth 3 (three) times daily. Patient not taking: Reported on 04/24/2017 12/15/16   Ward, Delice Bison, DO  omeprazole (PRILOSEC) 20 MG capsule Take 1 capsule (20 mg total) by mouth daily. Patient not taking: Reported on 04/24/2017 04/22/15   Mackuen, Courteney Lyn, MD  ondansetron (ZOFRAN ODT) 4 MG disintegrating tablet Take 1 tablet (4 mg total) by mouth every 8 (eight) hours as needed for nausea. Patient not taking: Reported on 09/01/2018 04/24/17   Tanna Furry, MD  ondansetron (ZOFRAN) 4 MG tablet Take 1 tablet (4 mg total) by mouth every 8 (eight) hours as needed for nausea or vomiting. Patient not taking: Reported on 09/01/2018 09/19/17   Pisciotta, Elmyra Ricks, PA-C  oxyCODONE-acetaminophen (PERCOCET/ROXICET) 5-325 MG tablet Take 1 tablet by mouth every 4 (four) hours as needed. Patient not taking: Reported on 09/01/2018 12/15/16   Ward, Delice Bison, DO  promethazine (PHENERGAN) 25 MG tablet Take 1 tablet (25 mg total) by mouth every 6 (six) hours as needed for nausea or vomiting. Patient not taking: Reported on  04/24/2017 12/15/16   Ward, Delice Bison, DO                                                                                                                                    Past Surgical History Past Surgical History:  Procedure Laterality Date  . CESAREAN SECTION    . FOOT SURGERY     Family History No family history on file.  Social History Social History   Tobacco Use  . Smoking status: Never Smoker  . Smokeless  tobacco: Never Used  Substance Use Topics  . Alcohol use: No  . Drug use: No   Allergies Bee venom, Iodinated diagnostic agents, Other, and Shellfish allergy  Review of Systems Review of Systems  Constitutional: Negative for fever.  Gastrointestinal: Positive for abdominal pain, nausea and vomiting (NBNB). Negative for constipation and diarrhea.   All other systems are reviewed and are negative for acute change except as noted in the HPI  Physical Exam Vital Signs  I have reviewed the triage vital signs BP (!) 150/96   Pulse 84   Temp 99.3 F (37.4 C) (Oral)   Resp 17   Ht 5\' 5"  (1.651 m)   Wt 65.8 kg   SpO2 100%   BMI 24.13 kg/m   Physical Exam Vitals signs reviewed.  Constitutional:      General: She is not in acute distress.    Appearance: She is well-developed. She is not diaphoretic.  HENT:     Head: Normocephalic and atraumatic.     Right Ear: External ear normal.     Left Ear: External ear normal.     Nose: Nose normal.  Eyes:     General: No scleral icterus.    Conjunctiva/sclera: Conjunctivae normal.  Neck:     Musculoskeletal: Normal range of motion.     Trachea: Phonation normal.  Cardiovascular:     Rate and Rhythm: Normal rate and regular rhythm.  Pulmonary:     Effort: Pulmonary effort is normal. No respiratory distress.     Breath sounds: No stridor.  Abdominal:     General: There is no distension.     Tenderness: There is abdominal tenderness (mild discomfort) in the periumbilical area. There is no guarding or rebound.  Musculoskeletal: Normal range of motion.  Neurological:     Mental Status: She is alert and oriented to person, place, and time.  Psychiatric:        Behavior: Behavior normal.     ED Results and Treatments Labs (all labs ordered are listed, but only abnormal results are displayed) Labs Reviewed  COMPREHENSIVE METABOLIC PANEL - Abnormal; Notable for the following components:      Result Value   Glucose, Bld 132 (*)     All other components within normal limits  CBC - Abnormal; Notable for the following components:   RBC 3.76 (*)    All other components within normal  limits  URINALYSIS, ROUTINE W REFLEX MICROSCOPIC - Abnormal; Notable for the following components:   Hgb urine dipstick SMALL (*)    Ketones, ur 20 (*)    All other components within normal limits  LIPASE, BLOOD  I-STAT BETA HCG BLOOD, ED ([facility], [facility], [facility] ONLY)                                                                                                                         EKG  EKG Interpretation  Date/Time:    Ventricular Rate:    PR Interval:    QRS Duration:   QT Interval:    QTC Calculation:   R Axis:     Text Interpretation:        Radiology No results found.  Pertinent labs & imaging results that were available during my care of the patient were reviewed by me and considered in my medical decision making (see chart for details).  Medications Ordered in ED Medications  sodium chloride flush (NS) 0.9 % injection 3 mL (has no administration in time range)  ondansetron (ZOFRAN-ODT) disintegrating tablet 4 mg (4 mg Oral Given 04/23/19 1800)  alum & mag hydroxide-simeth (MAALOX/MYLANTA) 200-200-20 MG/5ML suspension 30 mL (30 mLs Oral Given 04/24/19 0214)    And  lidocaine (XYLOCAINE) 2 % viscous mouth solution 15 mL (15 mLs Oral Given 04/24/19 0214)                                                                                                                                    Procedures Procedures  (including critical care time)  Medical Decision Making / ED Course I have reviewed the nursing notes for this encounter and the patient's prior records (if available in EHR or on provided paperwork).   Vickie Hernandez was evaluated in Emergency Department on 04/24/2019 for the symptoms described in the history of present illness. She was evaluated in the context of the global COVID-19 pandemic, which necessitated  consideration that the patient might be at risk for infection with the SARS-CoV-2 virus that causes COVID-19. Institutional protocols and algorithms that pertain to the evaluation of patients at risk for COVID-19 are in a state of rapid change based on information released by regulatory bodies including the CDC and federal and state organizations. These policies and algorithms were followed during the patient's care in the ED.  H/o cyclical vomiting with associated abd  discomfort.  Labs reassuring without leukocytosis or significant anemia.  No evidence of bili obstruction or pancreatitis.  No AKI.  Beta-hCG negative.  Urine without evidence of infection.  Patient provided with ODT Zofran in triage. Given a GI cocktail here in the emergency department.  Able to tolerate oral intake.  Low suspicion for serious intra-abdominal inflammatory/infectious process or bowel obstruction.  The patient appears reasonably screened and/or stabilized for discharge and I doubt any other medical condition or other [facility] requiring further screening, evaluation, or treatment in the ED at this time prior to discharge.  The patient is safe for discharge with strict return precautions.       Final Clinical Impression(s) / ED Diagnoses Final diagnoses:  Cyclical vomiting  Periumbilical abdominal pain     The patient appears reasonably screened and/or stabilized for discharge and I doubt any other medical condition or other [facility] requiring further screening, evaluation, or treatment in the ED at this time prior to discharge.  Disposition: Discharge  Condition: Good  I have discussed the results, Dx and Tx plan with the patient who expressed understanding and agree(s) with the plan. Discharge instructions discussed at great length. The patient was given strict return precautions who verbalized understanding of the instructions. No further questions at time of discharge.    ED Discharge Orders         Ordered     dicyclomine (BENTYL) 20 MG tablet  2 times daily     04/24/19 0358            Follow Up: Primary care provider  Schedule an appointment as soon as possible for a visit  As needed     This chart was dictated using voice recognition software.  Despite best efforts to proofread,  errors can occur which can change the documentation meaning.   Fatima Blank, MD 04/24/19 [phone]

## 2020-04-19 ENCOUNTER — Encounter (HOSPITAL_COMMUNITY): Payer: Self-pay | Admitting: Emergency Medicine

## 2020-04-19 ENCOUNTER — Emergency Department (HOSPITAL_COMMUNITY)
Admission: EM | Admit: 2020-04-19 | Discharge: 2020-04-19 | Disposition: A | Discharge status: Home / Self Care | Payer: Medicaid - Out of State | Attending: Emergency Medicine | Admitting: Emergency Medicine

## 2020-04-19 DIAGNOSIS — J45909 Unspecified asthma, uncomplicated: Secondary | ICD-10-CM | POA: Diagnosis not present

## 2020-04-19 DIAGNOSIS — G8929 Other chronic pain: Secondary | ICD-10-CM | POA: Insufficient documentation

## 2020-04-19 DIAGNOSIS — M5440 Lumbago with sciatica, unspecified side: Secondary | ICD-10-CM | POA: Diagnosis not present

## 2020-04-19 DIAGNOSIS — M5442 Lumbago with sciatica, left side: Secondary | ICD-10-CM

## 2020-04-19 DIAGNOSIS — M5441 Lumbago with sciatica, right side: Secondary | ICD-10-CM

## 2020-04-19 DIAGNOSIS — Z79899 Other long term (current) drug therapy: Secondary | ICD-10-CM | POA: Diagnosis not present

## 2020-04-19 DIAGNOSIS — M545 Low back pain, unspecified: Secondary | ICD-10-CM | POA: Diagnosis present

## 2020-04-19 MED ORDER — OXYCODONE-ACETAMINOPHEN 5-325 MG PO TABS
2.0000 | ORAL_TABLET | Freq: Once | ORAL | Status: AC
  Administered 2020-04-19: 2 via ORAL
  Filled 2020-04-19: qty 2

## 2020-04-19 MED ORDER — PREDNISONE 10 MG (21) PO TBPK: ORAL_TABLET | Freq: Every day | ORAL | 0 refills | Status: DC

## 2020-04-19 NOTE — ED Triage Notes (Signed)
Pt endorses lower back pain x3 days. Hx of back pain and DDD.

## 2020-04-19 NOTE — ED Provider Notes (Signed)
[facility] EMERGENCY DEPARTMENT Provider Note   CSN: [id number] Arrival date & time: 04/19/20  2703     History Chief Complaint  Patient presents with  . Back Pain    Vickie Hernandez is a 49 y.o. female.  HPI Patient is a 49 year old female with a history of DDD who presents to the emergency department with acute on chronic low back pain.  Patient states that she lost her footing a few days ago and began feeling low back pain shortly thereafter.  She reports a significant history of low back pain and states that her current symptoms are similar.  She has been taking tizanidine at home with mild short-term relief.  She states she is typically prescribed oxycodone but is visiting [place] from out of town and did not bring this medication with her because she takes it so irregularly.  No history of diabetes mellitus.  She reports a radiation of pain down her bilateral lower extremities, left greater than right.  No fevers, chills, IVDA, history of cancer, bowel or bladder incontinence, numbness, weakness.     Past Medical History:  Diagnosis Date  . Anemia   . Asthma   . Back pain     There are no problems to display for this patient.   Past Surgical History:  Procedure Laterality Date  . CESAREAN SECTION    . FOOT SURGERY       OB History    Gravida  0   Para  0   Term  0   Preterm  0   AB  0   Living        SAB  0   TAB  0   Ectopic  0   Multiple      Live Births              No family history on file.  Social History   Tobacco Use  . Smoking status: Never Smoker  . Smokeless tobacco: Never Used  Substance Use Topics  . Alcohol use: No  . Drug use: No    Home Medications Prior to Admission medications   Medication Sig Start Date End Date Taking? Authorizing Provider  albuterol (PROVENTIL) 2 MG tablet Take 1 tablet (2 mg total) by mouth 3 (three) times daily. Patient not taking: Reported on 04/24/2019 12/25/11  04/23/28  Blair Heys, MD  ciprofloxacin (CIPRO) 500 MG tablet Take 1 tablet (500 mg total) by mouth 2 (two) times daily. Patient not taking: Reported on 04/24/2017 12/15/16   Ward, Delice Bison, DO  dicyclomine (BENTYL) 20 MG tablet Take 1 tablet (20 mg total) by mouth 2 (two) times daily for 5 days. 04/24/19 04/29/19  Fatima Blank, MD  diphenoxylate-atropine (LOMOTIL) 2.5-0.025 MG tablet Take 1 tablet by mouth 4 (four) times daily as needed for diarrhea or loose stools. Patient not taking: Reported on 09/01/2018 04/24/17   Tanna Furry, MD  DULoxetine (CYMBALTA) 30 MG capsule Take 30 mg by mouth daily. 04/16/19   [provider]  escitalopram (LEXAPRO) 10 MG tablet Take 10 mg by mouth daily. 04/16/19   [provider]  hydrOXYzine (VISTARIL) 25 MG capsule Take 25 mg by mouth at bedtime. 04/16/19   [provider]  metoCLOPramide (REGLAN) 10 MG tablet Take 10 mg by mouth 3 (three) times daily. 03/06/19   [provider]  metroNIDAZOLE (FLAGYL) 500 MG tablet Take 1 tablet (500 mg total) by mouth 3 (three) times daily. Patient not taking: Reported  on 04/24/2017 12/15/16   Ward, Delice Bison, DO  omeprazole (PRILOSEC) 20 MG capsule Take 1 capsule (20 mg total) by mouth daily. Patient not taking: Reported on 04/24/2017 04/22/15   Mackuen, Courteney Lyn, MD  ondansetron (ZOFRAN ODT) 4 MG disintegrating tablet Take 1 tablet (4 mg total) by mouth every 8 (eight) hours as needed for nausea. Patient not taking: Reported on 09/01/2018 04/24/17   Tanna Furry, MD  ondansetron (ZOFRAN) 4 MG tablet Take 1 tablet (4 mg total) by mouth every 8 (eight) hours as needed for nausea or vomiting. Patient not taking: Reported on 09/01/2018 09/19/17   Pisciotta, Elmyra Ricks, PA-C  Oxycodone HCl 10 MG TABS Take 10 mg by mouth 4 (four) times daily as needed (for pain).  04/20/19   [provider]  oxyCODONE-acetaminophen (PERCOCET/ROXICET) 5-325 MG tablet Take 1 tablet by mouth every 4  (four) hours as needed. Patient not taking: Reported on 09/01/2018 12/15/16   Ward, Delice Bison, DO  promethazine (PHENERGAN) 25 MG tablet Take 1 tablet (25 mg total) by mouth every 6 (six) hours as needed for nausea or vomiting. Patient not taking: Reported on 04/24/2017 12/15/16   Ward, Delice Bison, DO    Allergies    Bee venom, Iodinated diagnostic agents, Other, and Shellfish allergy  Review of Systems   Review of Systems  Constitutional: Negative for chills and fever.  Respiratory: Negative for shortness of breath.   Cardiovascular: Negative for chest pain.  Gastrointestinal: Negative for abdominal pain.  Genitourinary: Negative for difficulty urinating, dysuria and urgency.  Musculoskeletal: Positive for back pain.  Skin: Negative for color change and wound.  Neurological: Negative for weakness and numbness.   Physical Exam Updated Vital Signs BP 104/78 (BP Location: Right Arm)   Pulse 82   Temp 98.3 F (36.8 C) (Oral)   Resp 20   Ht 5\' 5"  (1.651 m)   Wt 67.6 kg   SpO2 100%   BMI 24.79 kg/m   Physical Exam Vitals and nursing note reviewed.  Constitutional:      General: She is not in acute distress.    Appearance: Normal appearance. She is normal weight. She is not ill-appearing, toxic-appearing or diaphoretic.  HENT:     Head: Normocephalic and atraumatic.     Right Ear: External ear normal.     Left Ear: External ear normal.     Nose: Nose normal.     Mouth/Throat:     Mouth: Mucous membranes are moist.     Pharynx: Oropharynx is clear. No oropharyngeal exudate or posterior oropharyngeal erythema.  Eyes:     Extraocular Movements: Extraocular movements intact.  Cardiovascular:     Rate and Rhythm: Normal rate.     Pulses: Normal pulses.  Pulmonary:     Effort: Pulmonary effort is normal. No respiratory distress.     Breath sounds: No stridor.  Abdominal:     General: Abdomen is flat.  Musculoskeletal:        General: Tenderness present. Normal range of motion.      Cervical back: Normal range of motion and neck supple. No tenderness.     Comments: Moderate TTP noted diffusely across the midline lumbar spine as well as bilateral lumbar paraspinal musculature.  No step-off, crepitus, deformities.  Distal sensation intact in the bilateral lower extremities.  Symmetrical strength in the lower extremities with plantar flexion and dorsiflexion.  Difficult to assess strength and range of motion secondary to pain.  Positive straight leg raise, left greater than right.  Skin:    General: Skin is warm and dry.  Neurological:     General: No focal deficit present.     Mental Status: She is alert and oriented to person, place, and time.  Psychiatric:        Mood and Affect: Mood normal.        Behavior: Behavior normal.    ED Results / Procedures / Treatments   Labs (all labs ordered are listed, but only abnormal results are displayed) Labs Reviewed - No data to display  EKG None  Radiology No results found.  Procedures Procedures (including critical care time)  Medications Ordered in ED Medications  oxyCODONE-acetaminophen (PERCOCET/ROXICET) 5-325 MG per tablet 2 tablet (has no administration in time range)   ED Course  I have reviewed the triage vital signs and the nursing notes.  Pertinent labs & imaging results that were available during my care of the patient were reviewed by me and considered in my medical decision making (see chart for details).    MDM Rules/Calculators/A&P                          Pt is a 49 y.o. female that presents with a history, physical exam, and ED Clinical Course as noted above.   Patient presents today with acute on chronic low back pain.  Physical exam is significant for diffuse lumbar back pain.  Positive straight leg raise noted bilaterally.  Patient is neurovascularly intact in the lower extremities.  She feels her symptoms seem consistent with prior lumbar pain exacerbations.    She was given Percocet  while in the emergency department for her pain.  She has tizanidine at home which she can continue to take.  We will discharge her on a course of prednisone.  No history of diabetes mellitus, per patient.  Patient amenable with the above plan.  Feel that she is safe for discharge at this time and patient is agreeable.  Given strict return precautions.  Her questions were answered and she was amicable at the time of discharge.  Her vital signs are stable.  She is afebrile, not tachycardic, normotensive, not hypoxic.    An After Visit Summary was printed and given to the patient.  Patient discharged to home/self care.  Condition at discharge: Stable  Note: Portions of this report may have been transcribed using voice recognition software. Every effort was made to ensure accuracy; however, inadvertent computerized transcription errors may be present.   Final Clinical Impression(s) / ED Diagnoses Final diagnoses:  Bilateral low back pain with bilateral sciatica, unspecified chronicity    Rx / DC Orders ED Discharge Orders         Ordered    predniSONE (STERAPRED UNI-PAK 21 TAB) 10 MG (21) TBPK tablet  Daily        04/19/20 1050           Rayna Sexton, PA-C 04/19/20 1052    Charlesetta Shanks, MD 04/19/20 1750

## 2020-04-19 NOTE — Discharge Instructions (Addendum)
You can continue to take your tizanidine at home.  Please take this as prescribed.  I am also prescribing you a short course of prednisone.  This is a tapered medication, so you are going to take less and less medication over the course of the 10-day that you are being prescribed this.  If you develop any new or worsening symptoms such as fevers, chills, numbness, weakness, bowel or bladder incontinence, you need to return to the ER for immediate reevaluation.  It was a pleasure to meet you.

## 2020-05-02 ENCOUNTER — Encounter (HOSPITAL_COMMUNITY): Payer: Self-pay

## 2020-05-02 ENCOUNTER — Emergency Department (HOSPITAL_COMMUNITY)
Admission: EM | Admit: 2020-05-02 | Discharge: 2020-05-03 | Disposition: A | Discharge status: Home / Self Care | Payer: Medicaid - Out of State | Attending: Emergency Medicine | Admitting: Emergency Medicine

## 2020-05-02 ENCOUNTER — Other Ambulatory Visit: Payer: Self-pay

## 2020-05-02 DIAGNOSIS — R1084 Generalized abdominal pain: Secondary | ICD-10-CM | POA: Diagnosis not present

## 2020-05-02 DIAGNOSIS — R112 Nausea with vomiting, unspecified: Secondary | ICD-10-CM | POA: Diagnosis not present

## 2020-05-02 DIAGNOSIS — J45909 Unspecified asthma, uncomplicated: Secondary | ICD-10-CM | POA: Insufficient documentation

## 2020-05-02 LAB — COMPREHENSIVE METABOLIC PANEL
ALT: 11 U/L (ref 0–44)
AST: 15 U/L (ref 15–41)
Albumin: 5.1 g/dL — ABNORMAL HIGH (ref 3.5–5.0)
Alkaline Phosphatase: 60 U/L (ref 38–126)
Anion gap: 16 — ABNORMAL HIGH (ref 5–15)
BUN: 12 mg/dL (ref 6–20)
CO2: 17 mmol/L — ABNORMAL LOW (ref 22–32)
Calcium: 10.2 mg/dL (ref 8.9–10.3)
Chloride: 107 mmol/L (ref 98–111)
Creatinine, Ser: 0.75 mg/dL (ref 0.44–1.00)
GFR, Estimated: >60 mL/min (ref >60)
Glucose, Bld: 145 mg/dL — ABNORMAL HIGH (ref 70–99)
Potassium: 3.7 mmol/L (ref 3.5–5.1)
Sodium: 140 mmol/L (ref 135–145)
Total Bilirubin: 1.1 mg/dL (ref 0.3–1.2)
Total Protein: 8.5 g/dL — ABNORMAL HIGH (ref 6.5–8.1)

## 2020-05-02 LAB — CBC
HCT: 39.6 % (ref 36.0–46.0)
Hemoglobin: 13.1 g/dL (ref 12.0–15.0)
MCH: 32.3 pg (ref 26.0–34.0)
MCHC: 33.1 g/dL (ref 30.0–36.0)
MCV: 97.5 fL (ref 80.0–100.0)
Platelets: 126 10*3/uL — ABNORMAL LOW (ref 150–400)
RBC: 4.06 MIL/uL (ref 3.87–5.11)
RDW: 12.4 % (ref 11.5–15.5)
WBC: 10.8 10*3/uL — ABNORMAL HIGH (ref 4.0–10.5)
nRBC: 0 % (ref 0.0–0.2)

## 2020-05-02 LAB — I-STAT BETA HCG BLOOD, ED (MC, WL, AP ONLY): I-stat hCG, quantitative: <5 m[IU]/mL (ref <5)

## 2020-05-02 LAB — LIPASE, BLOOD: Lipase: 19 U/L (ref 11–51)

## 2020-05-02 MED ORDER — FAMOTIDINE IN NACL 20-0.9 MG/50ML-% IV SOLN
20.0000 mg | Freq: Once | INTRAVENOUS | Status: AC
  Administered 2020-05-02: 20 mg via INTRAVENOUS
  Filled 2020-05-02: qty 50

## 2020-05-02 MED ORDER — SODIUM CHLORIDE 0.9 % IV BOLUS
1000.0000 mL | Freq: Once | INTRAVENOUS | Status: AC
  Administered 2020-05-02: 1000 mL via INTRAVENOUS

## 2020-05-02 MED ORDER — ONDANSETRON HCL 4 MG/2ML IJ SOLN
4.0000 mg | Freq: Once | INTRAMUSCULAR | Status: AC
  Administered 2020-05-02: 4 mg via INTRAVENOUS
  Filled 2020-05-02: qty 2

## 2020-05-02 NOTE — ED Provider Notes (Signed)
[facility] DEPT Provider Note   CSN: [id number] Arrival date & time: 05/02/20  1947     History Chief Complaint  Patient presents with  . Abdominal Pain    Vickie Hernandez is a 49 y.o. female with a history of anemia & asthma who presents to the ED with complaints of N/V & abdominal pain that began @ 1300 today. Patient states sxs began w/ nausea with subsequent development of TNTC episodes of emesis. Developed generalized abdominal pain with this, pain worse with vomiting, no other alleviating/aggravating factors. Hx of similar in the past w/o diagnosis. Denies fever, chills, hematemesis, melena, hematochezia, diarrhea, dysuria, vaginal bleeding, or vaginal discharge. Last BM today was somewhat soft. Has smoked marijuana in the past- denies recent use.   HPI     Past Medical History:  Diagnosis Date  . Anemia   . Asthma   . Back pain     There are no problems to display for this patient.   Past Surgical History:  Procedure Laterality Date  . CESAREAN SECTION    . FOOT SURGERY       OB History    Gravida  0   Para  0   Term  0   Preterm  0   AB  0   Living        SAB  0   TAB  0   Ectopic  0   Multiple      Live Births              No family history on file.  Social History   Tobacco Use  . Smoking status: Never Smoker  . Smokeless tobacco: Never Used  Substance Use Topics  . Alcohol use: No  . Drug use: No    Home Medications Prior to Admission medications   Medication Sig Start Date End Date Taking? Authorizing Provider  Oxycodone HCl 10 MG TABS Take 10 mg by mouth 4 (four) times daily as needed (for pain).  04/20/19  Yes [provider]  tiZANidine (ZANAFLEX) 4 MG tablet Take 4 mg by mouth 3 (three) times daily. 03/28/20  Yes [provider]  albuterol (PROVENTIL) 2 MG tablet Take 1 tablet (2 mg total) by mouth 3 (three) times daily. Patient not taking: Reported on 04/24/2019 12/25/11  04/23/28  Blair Heys, MD  ciprofloxacin (CIPRO) 500 MG tablet Take 1 tablet (500 mg total) by mouth 2 (two) times daily. Patient not taking: Reported on 04/24/2017 12/15/16   Ward, Delice Bison, DO  dicyclomine (BENTYL) 20 MG tablet Take 1 tablet (20 mg total) by mouth 2 (two) times daily for 5 days. Patient not taking: Reported on 05/02/2020 04/24/19 04/29/19  Fatima Blank, MD  diphenoxylate-atropine (LOMOTIL) 2.5-0.025 MG tablet Take 1 tablet by mouth 4 (four) times daily as needed for diarrhea or loose stools. Patient not taking: Reported on 09/01/2018 04/24/17   Tanna Furry, MD  metroNIDAZOLE (FLAGYL) 500 MG tablet Take 1 tablet (500 mg total) by mouth 3 (three) times daily. Patient not taking: Reported on 04/24/2017 12/15/16   Ward, Delice Bison, DO  omeprazole (PRILOSEC) 20 MG capsule Take 1 capsule (20 mg total) by mouth daily. Patient not taking: Reported on 04/24/2017 04/22/15   Mackuen, Courteney Lyn, MD  ondansetron (ZOFRAN ODT) 4 MG disintegrating tablet Take 1 tablet (4 mg total) by mouth every 8 (eight) hours as needed for nausea. Patient not taking: Reported on 09/01/2018 04/24/17   Tanna Furry, MD  ondansetron (ZOFRAN) 4 MG tablet Take 1 tablet (4 mg total) by mouth every 8 (eight) hours as needed for nausea or vomiting. Patient not taking: Reported on 09/01/2018 09/19/17   Pisciotta, Elmyra Ricks, PA-C  oxyCODONE-acetaminophen (PERCOCET/ROXICET) 5-325 MG tablet Take 1 tablet by mouth every 4 (four) hours as needed. Patient not taking: Reported on 09/01/2018 12/15/16   Ward, Delice Bison, DO  predniSONE (STERAPRED UNI-PAK 21 TAB) 10 MG (21) TBPK tablet Take by mouth daily. Take 6 tabs by mouth daily  for 2 days, then 5 tabs for 2 days, then 4 tabs for 2 days, then 3 tabs for 2 days, 2 tabs for 2 days, then 1 tab by mouth daily for 2 days Patient not taking: Reported on 05/02/2020 04/19/20   Rayna Sexton, PA-C  promethazine (PHENERGAN) 25 MG tablet Take 1 tablet (25 mg total) by mouth every 6  (six) hours as needed for nausea or vomiting. Patient not taking: Reported on 04/24/2017 12/15/16   Ward, Delice Bison, DO    Allergies    Bee venom, Iodinated diagnostic agents, Other, and Shellfish allergy  Review of Systems   Review of Systems  Constitutional: Negative for chills and fever.  Respiratory: Negative for cough and shortness of breath.   Cardiovascular: Negative for chest pain.  Gastrointestinal: Positive for abdominal pain, nausea and vomiting. Negative for anal bleeding, blood in stool, constipation and diarrhea.  Genitourinary: Negative for dysuria, vaginal bleeding and vaginal discharge.  Neurological: Negative for syncope.  All other systems reviewed and are negative.   Physical Exam Updated Vital Signs BP 135/76 (BP Location: Left Arm)   Pulse 73   Temp 98.7 F (37.1 C) (Oral)   Resp 17   Ht 5\' 5"  (1.651 m)   Wt 63.5 kg   SpO2 100%   BMI 23.30 kg/m   Physical Exam Vitals and nursing note reviewed.  Constitutional:      General: She is not in acute distress.    Appearance: She is well-developed. She is not toxic-appearing.  HENT:     Head: Normocephalic and atraumatic.  Eyes:     General:        Right eye: No discharge.        Left eye: No discharge.     Conjunctiva/sclera: Conjunctivae normal.  Cardiovascular:     Rate and Rhythm: Normal rate and regular rhythm.  Pulmonary:     Effort: Pulmonary effort is normal. No respiratory distress.     Breath sounds: Normal breath sounds. No wheezing, rhonchi or rales.  Abdominal:     General: Bowel sounds are normal. There is no distension.     Palpations: Abdomen is soft.     Tenderness: There is no abdominal tenderness. There is no guarding or rebound.  Musculoskeletal:     Cervical back: Neck supple.  Skin:    General: Skin is warm and dry.     Findings: No rash.  Neurological:     Mental Status: She is alert.     Comments: Clear speech.   Psychiatric:        Behavior: Behavior normal.     ED  Results / Procedures / Treatments   Labs (all labs ordered are listed, but only abnormal results are displayed) Labs Reviewed  COMPREHENSIVE METABOLIC PANEL - Abnormal; Notable for the following components:      Result Value   CO2 17 (*)    Glucose, Bld 145 (*)    Total Protein 8.5 (*)    Albumin  5.1 (*)    Anion gap 16 (*)    All other components within normal limits  CBC - Abnormal; Notable for the following components:   WBC 10.8 (*)    Platelets 126 (*)    All other components within normal limits  URINALYSIS, ROUTINE W REFLEX MICROSCOPIC - Abnormal; Notable for the following components:   APPearance HAZY (*)    Ketones, ur 80 (*)    Protein, ur 30 (*)    All other components within normal limits  LIPASE, BLOOD  I-STAT BETA HCG BLOOD, ED ([facility], [facility], [facility] ONLY)    EKG None  Radiology No results found.  Procedures Procedures (including critical care time)  Medications Ordered in ED Medications  ondansetron (ZOFRAN) injection 4 mg (4 mg Intravenous Given 05/02/20 2239)  sodium chloride 0.9 % bolus 1,000 mL (0 mLs Intravenous Stopped 05/03/20 0223)  famotidine (PEPCID) IVPB 20 mg premix (0 mg Intravenous Stopped 05/02/20 2349)  haloperidol lactate (HALDOL) injection 2 mg (2 mg Intravenous Given 05/03/20 0022)  alum & mag hydroxide-simeth (MAALOX/MYLANTA) 200-200-20 MG/5ML suspension 30 mL (30 mLs Oral Given 05/03/20 0219)    And  lidocaine (XYLOCAINE) 2 % viscous mouth solution 15 mL (15 mLs Oral Given 05/03/20 0219)  sodium chloride 0.9 % bolus 1,000 mL (1,000 mLs Intravenous New Bag/Given 05/03/20 0223)    ED Course  I have reviewed the triage vital signs and the nursing notes.  Pertinent labs & imaging results that were available during my care of the patient were reviewed by me and considered in my medical decision making (see chart for details).    MDM Rules/Calculators/A&P                         Patient presents to the ED with complaints of N/V & abdominal pain.    Nontoxic, vitals WNL.  Exam w/ nontender abdomen, no peritoneal signs.   Additional history obtained:  Additional history obtained from chart review & nursing note review.  Lab Tests:  I reviewed and interpreted labs, which included:  CBC: Mild leukocytosis felt to be nonspecific, mild thrombocytopenia- PCP recheck.  CMP: low bicarb w/ mild anion gap elevation- likely dehydration related.  Lipase: WNL Preg test: Negative UA: Ketonuria, likely dehydration, no UTI.  ED Course:  Fluids, zofran, & pepcid ordered. Plan for re-assessment.   00:05: RE-EVAL: Patient reports mild improvement, however has continued nausea & discomfort. 2mg  of IV haldol ordered.   02:35: RE-EVAL: Patient is feeling much better, she is tolerating p.o. Repeat abdominal exam remains without significant tenderness or peritoneal signs. I have a low suspicion for acute surgical process- doubt appendicitis, cholecystitis, perforation, obstruction, or intra-abdominal abscess. Given improvement & reassuring exam feel patient is overall appropriate for discharge home at this time. I discussed results, treatment plan, need for follow-up, and return precautions with the patient. Provided opportunity for questions, patient confirmed understanding and is in agreement with plan.   Portions of this note were generated with [employer]. Dictation errors may occur despite best attempts at proofreading.  Final Clinical Impression(s) / ED Diagnoses Final diagnoses:  Non-intractable vomiting with nausea, unspecified vomiting type    Rx / DC Orders ED Discharge Orders         Ordered    ondansetron (ZOFRAN ODT) 4 MG disintegrating tablet  Every 8 hours PRN        05/03/20 0238    pantoprazole (PROTONIX) 40 MG tablet  Daily  05/03/20 0238    sucralfate (CARAFATE) 1 GM/10ML suspension  3 times daily with meals & bedtime        05/03/20 0238           Taelon Bendorf, Glynda Jaeger, PA-C 05/03/20 [place], Ankit, MD 05/03/20 1749

## 2020-05-02 NOTE — ED Notes (Signed)
Pt said she cannot use the bathroom.  She said she tried before she came here and could not urinate.

## 2020-05-02 NOTE — ED Triage Notes (Addendum)
Patient brought in by [facility] from home. Patient complaining of abdominal pain, nausea, vomiting that started this morning. Patient states she has this flair up every now and then. 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

## 2020-05-03 LAB — URINALYSIS, ROUTINE W REFLEX MICROSCOPIC
Bacteria, UA: NONE SEEN
Bilirubin Urine: NEGATIVE
Glucose, UA: NEGATIVE mg/dL
Hgb urine dipstick: NEGATIVE
Ketones, ur: 80 mg/dL — AB
Leukocytes,Ua: NEGATIVE
Nitrite: NEGATIVE
Protein, ur: 30 mg/dL — AB
Specific Gravity, Urine: 1.024 (ref 1.005–1.030)
pH: 5 (ref 5.0–8.0)

## 2020-05-03 MED ORDER — LIDOCAINE VISCOUS HCL 2 % MT SOLN
15.0000 mL | Freq: Once | OROMUCOSAL | Status: AC
  Administered 2020-05-03: 15 mL via ORAL
  Filled 2020-05-03: qty 15

## 2020-05-03 MED ORDER — SODIUM CHLORIDE 0.9 % IV BOLUS
1000.0000 mL | Freq: Once | INTRAVENOUS | Status: AC
  Administered 2020-05-03: 1000 mL via INTRAVENOUS

## 2020-05-03 MED ORDER — SUCRALFATE 1 GM/10ML PO SUSP: 1.0000 g | Freq: Three times a day (TID) | ORAL | 0 refills | Status: DC

## 2020-05-03 MED ORDER — PANTOPRAZOLE SODIUM 40 MG PO TBEC: 40.0000 mg | DELAYED_RELEASE_TABLET | Freq: Every day | ORAL | 0 refills | Status: DC

## 2020-05-03 MED ORDER — ONDANSETRON 4 MG PO TBDP: 4.0000 mg | ORAL_TABLET | Freq: Three times a day (TID) | ORAL | 0 refills | Status: DC | PRN

## 2020-05-03 MED ORDER — ALUM & MAG HYDROXIDE-SIMETH 200-200-20 MG/5ML PO SUSP
30.0000 mL | Freq: Once | ORAL | Status: AC
  Administered 2020-05-03: 30 mL via ORAL
  Filled 2020-05-03: qty 30

## 2020-05-03 MED ORDER — HALOPERIDOL LACTATE 5 MG/ML IJ SOLN
2.0000 mg | Freq: Once | INTRAMUSCULAR | Status: AC
  Administered 2020-05-03: 2 mg via INTRAVENOUS
  Filled 2020-05-03: qty 1

## 2020-05-03 NOTE — Discharge Instructions (Addendum)
You were seen in the emergency department today for abdominal pain and nausea and vomiting. Your labs showed that you were dehydrated, your bicarb was a bit low and your anion gap was a bit high and you also had some ketones in your urine and abnormal protein levels. These are each things that should be rechecked by her primary care provider within 1 week.  We are sending you home with the following medications to help with your symptoms:  - Protonix- please take 1 tablet in the morning prior to any meals to help with stomach acidity/pain.  - Carafate- please take prior to each meal and prior to bedtime to help with stomach acidity/pain.  - Zofran- please take every 8 hours as needed for nausea/vomiting.   We have prescribed you new medication(s) today. Discuss the medications prescribed today with your pharmacist as they can have adverse effects and interactions with your other medicines including over the counter and prescribed medications. Seek medical evaluation if you start to experience new or abnormal symptoms after taking one of these medicines, seek care immediately if you start to experience difficulty breathing, feeling of your throat closing, facial swelling, or rash as these could be indications of a more serious allergic reaction  Please follow attached diet guidelines.   Follow up with your primary care provider within 3 days for re-evaluation.  Return to the ER for new or worsening symptoms including but not limited to worsened pain, new pain, inability to keep fluids down, blood in vomit/stool, passing out, or any other concerns.

## 2020-12-21 ENCOUNTER — Encounter (HOSPITAL_COMMUNITY): Payer: Self-pay

## 2020-12-21 ENCOUNTER — Other Ambulatory Visit: Payer: Self-pay

## 2020-12-21 ENCOUNTER — Emergency Department (HOSPITAL_COMMUNITY): Payer: Medicaid - Out of State

## 2020-12-21 ENCOUNTER — Emergency Department (HOSPITAL_COMMUNITY)
Admission: EM | Admit: 2020-12-21 | Discharge: 2020-12-22 | Disposition: A | Discharge status: Home / Self Care | Payer: Medicaid - Out of State | Attending: Emergency Medicine | Admitting: Emergency Medicine

## 2020-12-21 DIAGNOSIS — K859 Acute pancreatitis without necrosis or infection, unspecified: Secondary | ICD-10-CM | POA: Insufficient documentation

## 2020-12-21 DIAGNOSIS — J45909 Unspecified asthma, uncomplicated: Secondary | ICD-10-CM | POA: Insufficient documentation

## 2020-12-21 DIAGNOSIS — R748 Abnormal levels of other serum enzymes: Secondary | ICD-10-CM | POA: Insufficient documentation

## 2020-12-21 DIAGNOSIS — R1013 Epigastric pain: Secondary | ICD-10-CM

## 2020-12-21 DIAGNOSIS — R1084 Generalized abdominal pain: Secondary | ICD-10-CM | POA: Diagnosis present

## 2020-12-21 LAB — COMPREHENSIVE METABOLIC PANEL
ALT: 19 U/L (ref 0–44)
AST: 43 U/L — ABNORMAL HIGH (ref 15–41)
Albumin: 4.8 g/dL (ref 3.5–5.0)
Alkaline Phosphatase: 45 U/L (ref 38–126)
Anion gap: 13 (ref 5–15)
BUN: 15 mg/dL (ref 6–20)
CO2: 24 mmol/L (ref 22–32)
Calcium: 10.3 mg/dL (ref 8.9–10.3)
Chloride: 100 mmol/L (ref 98–111)
Creatinine, Ser: 0.8 mg/dL (ref 0.44–1.00)
GFR, Estimated: >60 mL/min (ref >60)
Glucose, Bld: 93 mg/dL (ref 70–99)
Potassium: 3.7 mmol/L (ref 3.5–5.1)
Sodium: 137 mmol/L (ref 135–145)
Total Bilirubin: 1.6 mg/dL — ABNORMAL HIGH (ref 0.3–1.2)
Total Protein: 8.1 g/dL (ref 6.5–8.1)

## 2020-12-21 LAB — CBC WITH DIFFERENTIAL/PLATELET
Abs Immature Granulocytes: 0.01 10*3/uL (ref 0.00–0.07)
Basophils Absolute: 0 10*3/uL (ref 0.0–0.1)
Basophils Relative: 0 %
Eosinophils Absolute: 0.1 10*3/uL (ref 0.0–0.5)
Eosinophils Relative: 1 %
HCT: 41.7 % (ref 36.0–46.0)
Hemoglobin: 14.3 g/dL (ref 12.0–15.0)
Immature Granulocytes: 0 %
Lymphocytes Relative: 41 %
Lymphs Abs: 2.3 10*3/uL (ref 0.7–4.0)
MCH: 33.3 pg (ref 26.0–34.0)
MCHC: 34.3 g/dL (ref 30.0–36.0)
MCV: 97.2 fL (ref 80.0–100.0)
Monocytes Absolute: 0.4 10*3/uL (ref 0.1–1.0)
Monocytes Relative: 7 %
Neutro Abs: 2.8 10*3/uL (ref 1.7–7.7)
Neutrophils Relative %: 51 %
Platelets: 180 10*3/uL (ref 150–400)
RBC: 4.29 MIL/uL (ref 3.87–5.11)
RDW: 12.5 % (ref 11.5–15.5)
WBC: 5.5 10*3/uL (ref 4.0–10.5)
nRBC: 0 % (ref 0.0–0.2)

## 2020-12-21 LAB — URINALYSIS, ROUTINE W REFLEX MICROSCOPIC
Bacteria, UA: NONE SEEN
Bilirubin Urine: NEGATIVE
Glucose, UA: NEGATIVE mg/dL
Hgb urine dipstick: NEGATIVE
Ketones, ur: 80 mg/dL — AB
Leukocytes,Ua: NEGATIVE
Nitrite: NEGATIVE
Protein, ur: 100 mg/dL — AB
Specific Gravity, Urine: 1.032 — ABNORMAL HIGH (ref 1.005–1.030)
pH: 5 (ref 5.0–8.0)

## 2020-12-21 LAB — RAPID URINE DRUG SCREEN, HOSP PERFORMED
Amphetamines: NOT DETECTED
Barbiturates: NOT DETECTED
Benzodiazepines: NOT DETECTED
Cocaine: NOT DETECTED
Opiates: POSITIVE — AB
Tetrahydrocannabinol: POSITIVE — AB

## 2020-12-21 LAB — I-STAT BETA HCG BLOOD, ED (MC, WL, AP ONLY): I-stat hCG, quantitative: <5 m[IU]/mL (ref <5)

## 2020-12-21 LAB — LIPASE, BLOOD: Lipase: 103 U/L — ABNORMAL HIGH (ref 11–51)

## 2020-12-21 MED ORDER — ONDANSETRON HCL 4 MG/2ML IJ SOLN
4.0000 mg | Freq: Once | INTRAMUSCULAR | Status: AC
  Administered 2020-12-21: 4 mg via INTRAVENOUS
  Filled 2020-12-21: qty 2

## 2020-12-21 MED ORDER — HYDROMORPHONE HCL 1 MG/ML IJ SOLN
1.0000 mg | Freq: Once | INTRAMUSCULAR | Status: AC
  Administered 2020-12-21: 1 mg via INTRAVENOUS
  Filled 2020-12-21: qty 1

## 2020-12-21 MED ORDER — SODIUM CHLORIDE 0.9 % IV BOLUS
1000.0000 mL | Freq: Once | INTRAVENOUS | Status: AC
  Administered 2020-12-21: 1000 mL via INTRAVENOUS

## 2020-12-21 NOTE — ED Triage Notes (Signed)
Pt presents with mid abd pain, N/V, unable to eat or drink and dehydration x4 days. Pt here visiting from [place], hx gastritis and usually gets admitted when she is home. States every time she comes here she receives a liter of fluid and sent home. She didn't come in Sunday because she feels she never get adequate treatment when she comes to our ED. Pt here visiting her daughter

## 2020-12-21 NOTE — ED Notes (Signed)
Pt stated her pain is increasing and asked for pain medicine. Triage nurse notified.

## 2020-12-21 NOTE — ED Provider Notes (Signed)
[facility] EMERGENCY DEPARTMENT Provider Note   CSN: [id number] Arrival date & time: 12/21/20  1353     History Chief Complaint  Patient presents with   Nausea   Emesis   Abdominal Pain    Vickie Hernandez is a 50 y.o. female.  The history is provided by the patient. No language interpreter was used.  Emesis Severity:  Moderate Duration:  1 day Timing:  Constant How soon after eating does vomiting occur:  1 minute Progression:  Worsening Associated symptoms: abdominal pain   Risk factors: no alcohol use and no sick contacts   Abdominal Pain Pain location:  Generalized Pain radiates to:  Does not radiate Pain severity:  Moderate Timing:  Constant Progression:  Worsening Relieved by:  Nothing Worsened by:  Nothing Ineffective treatments:  None tried Associated symptoms: vomiting   Risk factors: not pregnant   Pt complains of abdominal pain.  Pt reports she has had similar in the past.      Past Medical History:  Diagnosis Date   Anemia    Asthma    Back pain     There are no problems to display for this patient.   Past Surgical History:  Procedure Laterality Date   CESAREAN SECTION     FOOT SURGERY       OB History     Gravida  0   Para  0   Term  0   Preterm  0   AB  0   Living         SAB  0   IAB  0   Ectopic  0   Multiple      Live Births              No family history on file.  Social History   Tobacco Use   Smoking status: Never   Smokeless tobacco: Never  Substance Use Topics   Alcohol use: No   Drug use: No    Home Medications Prior to Admission medications   Medication Sig Start Date End Date Taking? Authorizing Provider  ondansetron (ZOFRAN ODT) 4 MG disintegrating tablet Take 1 tablet (4 mg total) by mouth every 8 (eight) hours as needed for nausea or vomiting. 05/03/20   Petrucelli, Samantha R, PA-C  Oxycodone HCl 10 MG TABS Take 10 mg by mouth 4 (four) times daily as needed (for pain).   04/20/19   [provider]  pantoprazole (PROTONIX) 40 MG tablet Take 1 tablet (40 mg total) by mouth daily. 05/03/20   Petrucelli, Samantha R, PA-C  sucralfate (CARAFATE) 1 GM/10ML suspension Take 10 mLs (1 g total) by mouth 4 (four) times daily -  with meals and at bedtime. 05/03/20   Petrucelli, Samantha R, PA-C  tiZANidine (ZANAFLEX) 4 MG tablet Take 4 mg by mouth 3 (three) times daily. 03/28/20   [provider]  albuterol (PROVENTIL) 2 MG tablet Take 1 tablet (2 mg total) by mouth 3 (three) times daily. Patient not taking: Reported on 04/24/2019 12/25/11 05/03/20  Blair Heys, MD  dicyclomine (BENTYL) 20 MG tablet Take 1 tablet (20 mg total) by mouth 2 (two) times daily for 5 days. Patient not taking: Reported on 05/02/2020 04/24/19 05/03/20  Fatima Blank, MD  omeprazole (PRILOSEC) 20 MG capsule Take 1 capsule (20 mg total) by mouth daily. Patient not taking: Reported on 04/24/2017 04/22/15 05/03/20  Mackuen, Courteney Lyn, MD  promethazine (PHENERGAN) 25 MG tablet Take 1 tablet (25 mg total)  by mouth every 6 (six) hours as needed for nausea or vomiting. Patient not taking: Reported on 04/24/2017 12/15/16 05/03/20  Ward, Delice Bison, DO    Allergies    Bee venom, Iodinated diagnostic agents, Other, and Shellfish allergy  Review of Systems   Review of Systems  Gastrointestinal:  Positive for abdominal pain and vomiting.  All other systems reviewed and are negative.  Physical Exam Updated Vital Signs BP 124/87   Pulse 76   Temp 98.5 F (36.9 C) (Oral)   Resp 13   Ht 5\' 5"  (1.651 m)   Wt 62.6 kg   SpO2 99%   BMI 22.96 kg/m   Physical Exam Vitals and nursing note reviewed.  Constitutional:      Appearance: She is well-developed.  HENT:     Head: Normocephalic.  Eyes:     Extraocular Movements: Extraocular movements intact.  Cardiovascular:     Rate and Rhythm: Normal rate.  Pulmonary:     Effort: Pulmonary effort is normal.  Abdominal:     General:  Abdomen is flat. Bowel sounds are normal. There is no distension.     Palpations: Abdomen is soft.     Tenderness: There is generalized abdominal tenderness.  Musculoskeletal:        General: Normal range of motion.     Cervical back: Normal range of motion.  Skin:    General: Skin is warm.  Neurological:     General: No focal deficit present.     Mental Status: She is alert and oriented to person, place, and time.    ED Results / Procedures / Treatments   Labs (all labs ordered are listed, but only abnormal results are displayed) Labs Reviewed  LIPASE, BLOOD - Abnormal; Notable for the following components:      Result Value   Lipase 103 (*)    All other components within normal limits  COMPREHENSIVE METABOLIC PANEL - Abnormal; Notable for the following components:   AST 43 (*)    Total Bilirubin 1.6 (*)    All other components within normal limits  CBC WITH DIFFERENTIAL/PLATELET  URINALYSIS, ROUTINE W REFLEX MICROSCOPIC  RAPID URINE DRUG SCREEN, HOSP PERFORMED  I-STAT BETA HCG BLOOD, ED ([facility], [facility], [facility] ONLY)    EKG None  Radiology No results found.  Procedures Procedures   Medications Ordered in ED Medications  HYDROmorphone (DILAUDID) injection 1 mg (has no administration in time range)  sodium chloride 0.9 % bolus 1,000 mL (1,000 mLs Intravenous New Bag/Given 12/21/20 1959)  HYDROmorphone (DILAUDID) injection 1 mg (1 mg Intravenous Given 12/21/20 2000)  ondansetron (ZOFRAN) injection 4 mg (4 mg Intravenous Given 12/21/20 2000)    ED Course  I have reviewed the triage vital signs and the nursing notes.  Pertinent labs & imaging results that were available during my care of the patient were reviewed by me and considered in my medical decision making (see chart for details).    MDM Rules/Calculators/A&P                          MDM:  Pt given iv fluids, dilaudid and zofran.  Pt labs show elevated lipase and slight elevation of tbili  Ultrasound is normal.  Pt  counseled on results.  Pt reports she feels better.  Pt will follow up with her Gi doctor for recheck  Final Clinical Impression(s) / ED Diagnoses Final diagnoses:  Acute pancreatitis, unspecified complication status, unspecified pancreatitis type  Rx / DC Orders ED Discharge Orders          Ordered    HYDROcodone-acetaminophen (NORCO/VICODIN) 5-325 MG tablet  Every 4 hours PRN        12/22/20 0006    ondansetron (ZOFRAN ODT) 4 MG disintegrating tablet  Every 8 hours PRN        12/22/20 0006          An After Visit Summary was printed and given to the patient.    Fransico Meadow, [place] 12/22/20 0012    Valarie Merino, MD 12/24/20 [phone]

## 2020-12-21 NOTE — ED Notes (Signed)
Pt ambulated to the restroom and is stating that the abd pain is coming back. Provider sent a message about the pain

## 2020-12-21 NOTE — ED Provider Notes (Signed)
Emergency Medicine Provider Triage Evaluation Note  Vickie Hernandez , a 50 y.o. female  was evaluated in triage.  Pt complains of N/D/  No diarrhea.  This started 4 days ago.  She says this feels like her usual episodes of cyclic vomiting.  The pain is in the usual locations.  She denies concern for other cause of her symptoms.  She reports that she has not urinated since Monday due to poor po intake.   Review of Systems  Positive: N/V/Abd pain Negative: Diarrhea, fevers  Physical Exam  BP (!) 156/105 (BP Location: Right Arm)   Pulse (!) 107   Temp 98.5 F (36.9 C) (Oral)   Resp 16   SpO2 100%  Gen:   Awake, no distress   Resp:  Normal effort  MSK:   Moves extremities without difficulty  Other:  Patient is awake and alert.  Answers questions without difficulty.  Mild diffuse abdominal tenderness.  Medical Decision Making  Medically screening exam initiated at 4:19 PM.  Appropriate orders placed.  Vickie Hernandez was informed that the remainder of the evaluation will be completed by another provider, this initial triage assessment does not replace that evaluation, and the importance of remaining in the ED until their evaluation is complete.     Lorin Glass, PA-C 12/21/20 1623    Sherwood Gambler, MD 12/24/20 1520

## 2020-12-22 MED ORDER — OXYCODONE-ACETAMINOPHEN 5-325 MG PO TABS
1.0000 | ORAL_TABLET | Freq: Once | ORAL | Status: AC
  Administered 2020-12-22: 1 via ORAL
  Filled 2020-12-22: qty 1

## 2020-12-22 MED ORDER — DICYCLOMINE HCL 20 MG PO TABS: 20.0000 mg | ORAL_TABLET | Freq: Two times a day (BID) | ORAL | 0 refills | Status: AC

## 2020-12-22 MED ORDER — ONDANSETRON 4 MG PO TBDP: 4.0000 mg | ORAL_TABLET | Freq: Three times a day (TID) | ORAL | 0 refills | Status: AC | PRN

## 2020-12-22 MED ORDER — HYDROCODONE-ACETAMINOPHEN 5-325 MG PO TABS: 1.0000 | ORAL_TABLET | ORAL | 0 refills | Status: AC | PRN

## 2020-12-22 MED ORDER — SUCRALFATE 1 G PO TABS: 1.0000 g | ORAL_TABLET | Freq: Three times a day (TID) | ORAL | 0 refills | Status: AC

## 2020-12-22 NOTE — ED Notes (Signed)
Pt called out c/o pain. Provider sent a message reference same

## 2020-12-22 NOTE — ED Notes (Signed)
Provider at bedside

## 2020-12-22 NOTE — Discharge Instructions (Addendum)
Clear liquids for the next 24 hours then progress to solids.  Schedule to see your Gi doctor for recheck.  Return if any problems.

## 2020-12-22 NOTE — ED Notes (Signed)
Patient given apple and cranberry juice

## 2021-03-23 ENCOUNTER — Emergency Department (HOSPITAL_COMMUNITY)
Admission: EM | Admit: 2021-03-23 | Discharge: 2021-03-23 | Disposition: A | Discharge status: Home / Self Care | Payer: Medicaid - Out of State | Attending: Emergency Medicine | Admitting: Emergency Medicine

## 2021-03-23 ENCOUNTER — Other Ambulatory Visit: Payer: Self-pay

## 2021-03-23 ENCOUNTER — Encounter (HOSPITAL_COMMUNITY): Payer: Self-pay | Admitting: Emergency Medicine

## 2021-03-23 DIAGNOSIS — R1010 Upper abdominal pain, unspecified: Secondary | ICD-10-CM | POA: Insufficient documentation

## 2021-03-23 DIAGNOSIS — Z5321 Procedure and treatment not carried out due to patient leaving prior to being seen by health care provider: Secondary | ICD-10-CM | POA: Insufficient documentation

## 2021-03-23 LAB — COMPREHENSIVE METABOLIC PANEL
ALT: 11 U/L (ref 0–44)
AST: 16 U/L (ref 15–41)
Albumin: 4.1 g/dL (ref 3.5–5.0)
Alkaline Phosphatase: 59 U/L (ref 38–126)
Anion gap: 8 (ref 5–15)
BUN: 13 mg/dL (ref 6–20)
CO2: 21 mmol/L — ABNORMAL LOW (ref 22–32)
Calcium: 9.1 mg/dL (ref 8.9–10.3)
Chloride: 109 mmol/L (ref 98–111)
Creatinine, Ser: 0.69 mg/dL (ref 0.44–1.00)
GFR, Estimated: >60 mL/min (ref >60)
Glucose, Bld: 97 mg/dL (ref 70–99)
Potassium: 3.8 mmol/L (ref 3.5–5.1)
Sodium: 138 mmol/L (ref 135–145)
Total Bilirubin: 0.5 mg/dL (ref 0.3–1.2)
Total Protein: 7.2 g/dL (ref 6.5–8.1)

## 2021-03-23 LAB — CBC WITH DIFFERENTIAL/PLATELET
Abs Immature Granulocytes: 0.01 10*3/uL (ref 0.00–0.07)
Basophils Absolute: 0.1 10*3/uL (ref 0.0–0.1)
Basophils Relative: 1 %
Eosinophils Absolute: 0.3 10*3/uL (ref 0.0–0.5)
Eosinophils Relative: 8 %
HCT: 36.9 % (ref 36.0–46.0)
Hemoglobin: 11.9 g/dL — ABNORMAL LOW (ref 12.0–15.0)
Immature Granulocytes: 0 %
Lymphocytes Relative: 60 %
Lymphs Abs: 2.7 10*3/uL (ref 0.7–4.0)
MCH: 32.2 pg (ref 26.0–34.0)
MCHC: 32.2 g/dL (ref 30.0–36.0)
MCV: 100 fL (ref 80.0–100.0)
Monocytes Absolute: 0.2 10*3/uL (ref 0.1–1.0)
Monocytes Relative: 5 %
Neutro Abs: 1.2 10*3/uL — ABNORMAL LOW (ref 1.7–7.7)
Neutrophils Relative %: 26 %
Platelets: 187 10*3/uL (ref 150–400)
RBC: 3.69 MIL/uL — ABNORMAL LOW (ref 3.87–5.11)
RDW: 12.5 % (ref 11.5–15.5)
WBC: 4.5 10*3/uL (ref 4.0–10.5)
nRBC: 0 % (ref 0.0–0.2)

## 2021-03-23 LAB — LIPASE, BLOOD: Lipase: 35 U/L (ref 11–51)

## 2021-03-23 NOTE — ED Notes (Signed)
Pt bracelet and stickers found in trash can pt seen outside leaving.

## 2021-03-23 NOTE — ED Triage Notes (Signed)
Pt endorses upper abd pain intermittently for a few days. N/V yesterday.

## 2021-03-23 NOTE — ED Notes (Signed)
Pt not responding for vital recheck.

## 2021-03-23 NOTE — ED Provider Notes (Signed)
Emergency Medicine Provider Triage Evaluation Note  Vickie Hernandez , a 49 y.o. female  was evaluated in triage.  Pt complains of upper abdominal pain, constant dull pain, occasion sharp pain. Similar to prior GERD/gastritis. With nausea, 1 episode vomiting yesterday. NO changes bowel or bladder habits.  Review of Systems  Positive: Abdominal pain Negative: CP, changes bowel or bladder habits  Physical Exam  BP 123/78 (BP Location: Left Arm)   Pulse 74   Temp 98.9 F (37.2 C) (Oral)   Resp 16   Ht 5\' 5"  (1.651 m)   SpO2 100%   BMI 22.96 kg/m  Gen:   Awake, no distress   Resp:  Normal effort  MSK:   Moves extremities without difficulty  Other:  Generalized abdominal tenderness  Medical Decision Making  Medically screening exam initiated at 8:09 AM.  Appropriate orders placed.  Vickie Hernandez was informed that the remainder of the evaluation will be completed by another provider, this initial triage assessment does not replace that evaluation, and the importance of remaining in the ED until their evaluation is complete.     Tacy Learn, PA-C 03/23/21 [id number]    Lucrezia Starch, MD 03/23/21 [phone]

## 2021-03-23 NOTE — ED Notes (Signed)
Name called for updated vitals, no response

## 2024-02-25 ENCOUNTER — Emergency Department (HOSPITAL_COMMUNITY)
Admission: EM | Admit: 2024-02-25 | Discharge: 2024-02-25 | Disposition: A | Discharge status: Home / Self Care | Attending: Emergency Medicine | Admitting: Emergency Medicine

## 2024-02-25 ENCOUNTER — Encounter (HOSPITAL_COMMUNITY): Payer: Self-pay

## 2024-02-25 ENCOUNTER — Other Ambulatory Visit: Payer: Self-pay

## 2024-02-25 ENCOUNTER — Emergency Department (HOSPITAL_COMMUNITY)

## 2024-02-25 DIAGNOSIS — R109 Unspecified abdominal pain: Secondary | ICD-10-CM

## 2024-02-25 DIAGNOSIS — R1032 Left lower quadrant pain: Secondary | ICD-10-CM | POA: Diagnosis present

## 2024-02-25 DIAGNOSIS — D219 Benign neoplasm of connective and other soft tissue, unspecified: Secondary | ICD-10-CM

## 2024-02-25 DIAGNOSIS — D259 Leiomyoma of uterus, unspecified: Secondary | ICD-10-CM | POA: Diagnosis not present

## 2024-02-25 DIAGNOSIS — R3129 Other microscopic hematuria: Secondary | ICD-10-CM

## 2024-02-25 LAB — URINALYSIS, ROUTINE W REFLEX MICROSCOPIC
Bacteria, UA: NONE SEEN
Bilirubin Urine: NEGATIVE
Glucose, UA: NEGATIVE mg/dL
Ketones, ur: NEGATIVE mg/dL
Leukocytes,Ua: NEGATIVE
Nitrite: NEGATIVE
Protein, ur: NEGATIVE mg/dL
Specific Gravity, Urine: 1.011 (ref 1.005–1.030)
pH: 6 (ref 5.0–8.0)

## 2024-02-25 LAB — CBC WITH DIFFERENTIAL/PLATELET
Abs Immature Granulocytes: 0.01 K/uL (ref 0.00–0.07)
Basophils Absolute: 0 K/uL (ref 0.0–0.1)
Basophils Relative: 1 %
Eosinophils Absolute: 0.1 K/uL (ref 0.0–0.5)
Eosinophils Relative: 2 %
HCT: 37.5 % (ref 36.0–46.0)
Hemoglobin: 12.1 g/dL (ref 12.0–15.0)
Immature Granulocytes: 0 %
Lymphocytes Relative: 47 %
Lymphs Abs: 2 K/uL (ref 0.7–4.0)
MCH: 30.9 pg (ref 26.0–34.0)
MCHC: 32.3 g/dL (ref 30.0–36.0)
MCV: 95.9 fL (ref 80.0–100.0)
Monocytes Absolute: 0.2 K/uL (ref 0.1–1.0)
Monocytes Relative: 4 %
Neutro Abs: 2 K/uL (ref 1.7–7.7)
Neutrophils Relative %: 46 %
Platelets: 209 K/uL (ref 150–400)
RBC: 3.91 MIL/uL (ref 3.87–5.11)
RDW: 13 % (ref 11.5–15.5)
WBC: 4.3 K/uL (ref 4.0–10.5)
nRBC: 0 % (ref 0.0–0.2)

## 2024-02-25 LAB — COMPREHENSIVE METABOLIC PANEL WITH GFR
ALT: 11 U/L (ref 0–44)
AST: 17 U/L (ref 15–41)
Albumin: 4.5 g/dL (ref 3.5–5.0)
Alkaline Phosphatase: 82 U/L (ref 38–126)
Anion gap: 12 (ref 5–15)
BUN: 10 mg/dL (ref 6–20)
CO2: 23 mmol/L (ref 22–32)
Calcium: 9.4 mg/dL (ref 8.9–10.3)
Chloride: 106 mmol/L (ref 98–111)
Creatinine, Ser: 0.7 mg/dL (ref 0.44–1.00)
GFR, Estimated: >60 mL/min (ref >60)
Glucose, Bld: 93 mg/dL (ref 70–99)
Potassium: 4.2 mmol/L (ref 3.5–5.1)
Sodium: 141 mmol/L (ref 135–145)
Total Bilirubin: 0.4 mg/dL (ref 0.0–1.2)
Total Protein: 7.4 g/dL (ref 6.5–8.1)

## 2024-02-25 LAB — LIPASE, BLOOD: Lipase: 19 U/L (ref 11–51)

## 2024-02-25 MED ORDER — ONDANSETRON 4 MG PO TBDP: 4.0000 mg | ORAL_TABLET | Freq: Three times a day (TID) | ORAL | 0 refills | Status: AC | PRN

## 2024-02-25 MED ORDER — MORPHINE SULFATE (PF) 4 MG/ML IV SOLN
4.0000 mg | Freq: Once | INTRAVENOUS | Status: AC
  Administered 2024-02-25: 4 mg via INTRAVENOUS
  Filled 2024-02-25: qty 1

## 2024-02-25 MED ORDER — SODIUM CHLORIDE 0.9 % IV BOLUS
1000.0000 mL | Freq: Once | INTRAVENOUS | Status: AC
  Administered 2024-02-25: 1000 mL via INTRAVENOUS

## 2024-02-25 MED ORDER — ONDANSETRON HCL 4 MG/2ML IJ SOLN
4.0000 mg | Freq: Once | INTRAMUSCULAR | Status: AC
  Administered 2024-02-25: 4 mg via INTRAVENOUS
  Filled 2024-02-25: qty 2

## 2024-02-25 MED ORDER — SODIUM CHLORIDE 0.9 % IV SOLN: INTRAVENOUS | Status: DC

## 2024-02-25 MED ORDER — HYDROCODONE-ACETAMINOPHEN 5-325 MG PO TABS: 2.0000 | ORAL_TABLET | Freq: Four times a day (QID) | ORAL | 0 refills | Status: AC | PRN

## 2024-02-25 MED ORDER — DIPHENHYDRAMINE HCL 25 MG PO CAPS
25.0000 mg | ORAL_CAPSULE | Freq: Once | ORAL | Status: AC
  Administered 2024-02-25: 25 mg via ORAL
  Filled 2024-02-25: qty 1

## 2024-02-25 MED ORDER — IBUPROFEN 400 MG PO TABS: 400.0000 mg | ORAL_TABLET | Freq: Three times a day (TID) | ORAL | 0 refills | Status: AC

## 2024-02-25 MED ORDER — KETOROLAC TROMETHAMINE 30 MG/ML IJ SOLN
15.0000 mg | Freq: Once | INTRAMUSCULAR | Status: AC
  Administered 2024-02-25: 15 mg via INTRAVENOUS
  Filled 2024-02-25: qty 1

## 2024-02-25 MED ORDER — FENTANYL CITRATE PF 50 MCG/ML IJ SOSY
50.0000 ug | PREFILLED_SYRINGE | Freq: Once | INTRAMUSCULAR | Status: AC
  Administered 2024-02-25: 50 ug via INTRAVENOUS
  Filled 2024-02-25: qty 1

## 2024-02-25 NOTE — ED Provider Notes (Signed)
 [place] [facility] AT [facility] Provider Note   CSN: [id number] Arrival date & time: 02/25/24  0830     Patient presents with: Flank Pain (/) and Emesis   Vickie Hernandez is a 53 y.o. female.   HPI Patient presents with left lower quadrant, left flank and bilateral inguinal pain. There is associated anorexia, nausea, loose stool.  She has had change in bowel movements over the past few days, but over the past 12 hours has had multiple episodes of diarrhea.  No vomiting. No fever, chest pain, dyspnea. Patient was well prior to the onset aside from change in bowel movements for the past few days.    Prior to Admission medications   Medication Sig Start Date End Date Taking? Authorizing Provider  HYDROcodone -acetaminophen  (NORCO/VICODIN) 5-325 MG tablet Take 2 tablets by mouth every 6 (six) hours as needed for severe pain (pain score 7-10). 02/25/24  Yes Garrick Charleston, MD  ibuprofen  (ADVIL ) 400 MG tablet Take 1 tablet (400 mg total) by mouth 3 (three) times daily for 3 days. Take one tablet three times daily for three days 02/25/24 02/28/24 Yes Garrick Charleston, MD  ondansetron  (ZOFRAN -ODT) 4 MG disintegrating tablet Take 1 tablet (4 mg total) by mouth every 8 (eight) hours as needed for nausea or vomiting. 02/25/24  Yes Garrick Charleston, MD  dicyclomine  (BENTYL ) 20 MG tablet Take 1 tablet (20 mg total) by mouth 2 (two) times daily. 12/22/20   Odell Balls, PA-C  ondansetron  (ZOFRAN  ODT) 4 MG disintegrating tablet Take 1 tablet (4 mg total) by mouth every 8 (eight) hours as needed for nausea or vomiting. 12/22/20   Sofia, Leslie K, PA-C  sucralfate  (CARAFATE ) 1 g tablet Take 1 tablet (1 g total) by mouth 4 (four) times daily -  with meals and at bedtime. 12/22/20   Odell Balls, PA-C  albuterol  (PROVENTIL ) 2 MG tablet Take 1 tablet (2 mg total) by mouth 3 (three) times daily. Patient not taking: Reported on 04/24/2019 12/25/11 05/03/20  Douglass Rector, MD  omeprazole   (PRILOSEC) 20 MG capsule Take 1 capsule (20 mg total) by mouth daily. Patient not taking: Reported on 04/24/2017 04/22/15 05/03/20  Mackuen, Courteney Lyn, MD  promethazine  (PHENERGAN ) 25 MG tablet Take 1 tablet (25 mg total) by mouth every 6 (six) hours as needed for nausea or vomiting. Patient not taking: Reported on 04/24/2017 12/15/16 05/03/20  Ward, Josette SAILOR, DO    Allergies: Bee venom, Iodinated contrast media, Other, and Shellfish allergy    Review of Systems  Updated Vital Signs BP (!) 108/94 (BP Location: Right Arm)   Pulse 65   Temp 98.5 F (36.9 C) (Oral)   Resp 18   Ht 1.651 m (5' 5)   Wt 77.1 kg   LMP  (Exact Date)   SpO2 100%   BMI 28.29 kg/m   Physical Exam Vitals and nursing note reviewed.  Constitutional:      General: She is not in acute distress.    Appearance: She is well-developed.  HENT:     Head: Normocephalic and atraumatic.  Eyes:     Conjunctiva/sclera: Conjunctivae normal.  Cardiovascular:     Rate and Rhythm: Normal rate and regular rhythm.     Pulses: Normal pulses.  Pulmonary:     Effort: Pulmonary effort is normal. No respiratory distress.     Breath sounds: No stridor.  Abdominal:     General: There is no distension.     Tenderness: There is abdominal tenderness. There  is guarding.  Skin:    General: Skin is warm and dry.  Neurological:     Mental Status: She is alert and oriented to person, place, and time.     Cranial Nerves: No cranial nerve deficit.  Psychiatric:        Mood and Affect: Mood normal.     (all labs ordered are listed, but only abnormal results are displayed) Labs Reviewed  URINALYSIS, ROUTINE W REFLEX MICROSCOPIC - Abnormal; Notable for the following components:      Result Value   Hgb urine dipstick SMALL (*)    All other components within normal limits  COMPREHENSIVE METABOLIC PANEL WITH GFR  LIPASE, BLOOD  CBC WITH DIFFERENTIAL/PLATELET    EKG: None  Radiology: CT ABDOMEN PELVIS WO CONTRAST Result  Date: 02/25/2024 CLINICAL DATA:  Left lower quadrant abdominal pain, kidney stone, flank pain EXAM: CT ABDOMEN AND PELVIS WITHOUT CONTRAST TECHNIQUE: Multidetector CT imaging of the abdomen and pelvis was performed following the standard protocol without IV contrast. RADIATION DOSE REDUCTION: This exam was performed according to the departmental dose-optimization program which includes automated exposure control, adjustment of the mA and/or kV according to patient size and/or use of iterative reconstruction technique. COMPARISON:  September 28, 2023 CT FINDINGS: Lower chest: Unremarkable. Hepatobiliary: No suspicious liver lesion. No gallstones, gallbladder wall thickening, or biliary dilatation. Pancreas: Unremarkable. Spleen: Unremarkable. Adrenals/Urinary Tract: Adrenal glands are unremarkable. No nephrolithiasis or hydronephrosis. Stomach/Bowel: No evidence of bowel obstruction or inflammation. Vascular/Lymphatic: Normal caliber aorta. No lymphadenopathy by size criteria. Reproductive: IUD in place. Unchanged posterior calcified uterine fibroid. No adnexal masses. Other: No free air or free fluid. Musculoskeletal: No acute osseous findings. IMPRESSION: No acute pathology in the abdomen or pelvis. Unchanged calcified uterine fibroid. IUD in place. Electronically Signed   By: Michaeline Blanch M.D.   On: 02/25/2024 10:43     Procedures   Medications Ordered in the ED  sodium chloride  0.9 % bolus 1,000 mL (0 mLs Intravenous Stopped 02/25/24 1245)    And  0.9 %  sodium chloride  infusion ( Intravenous New Bag/Given 02/25/24 1245)  fentaNYL  (SUBLIMAZE ) injection 50 mcg (50 mcg Intravenous Given 02/25/24 1053)  ondansetron  (ZOFRAN ) injection 4 mg (4 mg Intravenous Given 02/25/24 1054)  ketorolac  (TORADOL ) 30 MG/ML injection 15 mg (15 mg Intravenous Given 02/25/24 1354)  morphine  (PF) 4 MG/ML injection 4 mg (4 mg Intravenous Given 02/25/24 1355)  diphenhydrAMINE  (BENADRYL ) capsule 25 mg (25 mg Oral Given 02/25/24 1518)                                     Medical Decision Making Adult female presents with left lower quadrant, left flank pain, change in bowel movements.  Description of pain suggestive of kidney stone versus diverticulitis, patient has some diffuse guarding, suggestive peritonitis, CT, labs ordered. Cardiac 90 sinus normal pulse ox 100% room air normal Meds fluids started.  Amount and/or Complexity of Data Reviewed Labs: ordered. Decision-making details documented in ED Course. Radiology: ordered and independent interpretation performed. Decision-making details documented in ED Course.  Risk Prescription drug management. Decision regarding hospitalization.   3:20 PM On repeat exam patient is awake, alert, in no distress, speaking clearly.  We reviewed the CT results, lab results, labs most notable for mild hematuria, and given her flank pain, inguinal pain, suspicion for passed kidney stone versus calcified fibroid contributing to her pain.  No evidence for obstruction, bacteremia, sepsis,  patient has improved here is hemodynamically stable, will follow-up with primary care and gynecology upon return to [place].     Final diagnoses:  Flank pain  Other microscopic hematuria  Fibroid    ED Discharge Orders          Ordered    HYDROcodone -acetaminophen  (NORCO/VICODIN) 5-325 MG tablet  Every 6 hours PRN        02/25/24 1519    ondansetron  (ZOFRAN -ODT) 4 MG disintegrating tablet  Every 8 hours PRN        02/25/24 1519    ibuprofen  (ADVIL ) 400 MG tablet  3 times daily        02/25/24 1519               Garrick Charleston, MD 02/25/24 1520

## 2024-02-25 NOTE — ED Notes (Signed)
 Difficult stick   patient states they usually have to use the machine to do it

## 2024-02-25 NOTE — ED Notes (Signed)
 US  PIV placed.

## 2024-02-25 NOTE — ED Triage Notes (Signed)
 Pt presents to ED from home C/O L flank pain, n/v X 2 days. Denies dysuria, fever, chills.

## 2024-02-25 NOTE — Discharge Instructions (Signed)
 Today's evaluation has been reassuring.  Your pain is likely due to either a recently passed kidney stone or fibroids in your uterus. Please follow-up with your physician upon return to [place]. Below is the interpretation from today's CT scan:   CT FINDINGS: Lower chest: Unremarkable. Hepatobiliary: No suspicious liver lesion. No gallstones, gallbladder wall thickening, or biliary dilatation. Pancreas: Unremarkable. Spleen: Unremarkable. Adrenals/Urinary Tract: Adrenal glands are unremarkable. No nephrolithiasis or hydronephrosis. Stomach/Bowel: No evidence of bowel obstruction or inflammation. Vascular/Lymphatic: Normal caliber aorta. No lymphadenopathy by size criteria. Reproductive: IUD in place. Unchanged posterior calcified uterine fibroid. No adnexal masses. Other: No free air or free fluid. Musculoskeletal: No acute osseous findings.

## 2024-03-17 ENCOUNTER — Emergency Department (HOSPITAL_COMMUNITY)
Admission: EM | Admit: 2024-03-17 | Discharge: 2024-03-17 | Disposition: A | Discharge status: Home / Self Care | Attending: Emergency Medicine | Admitting: Emergency Medicine

## 2024-03-17 ENCOUNTER — Other Ambulatory Visit: Payer: Self-pay

## 2024-03-17 ENCOUNTER — Encounter (HOSPITAL_COMMUNITY): Payer: Self-pay

## 2024-03-17 DIAGNOSIS — M79641 Pain in right hand: Secondary | ICD-10-CM | POA: Diagnosis present

## 2024-03-17 MED ORDER — NAPROXEN 500 MG PO TABS: 500.0000 mg | ORAL_TABLET | Freq: Two times a day (BID) | ORAL | 0 refills | Status: AC

## 2024-03-17 NOTE — Discharge Instructions (Addendum)
 You are seen today for right hand pain.  Your symptoms today suggest this may be carpal tunnel versus cubital tunnel syndrome.  Recommend he to follow-up with your PCP for further management.  I am sending in an anti-inflammatory medication for you to use as needed for helping with relief.  Recommend continue to follow-up with your PCP for further management.  Be sure to rest to help with healing.  Only use brace at night.  Please take Naprosyn , 500mg  by mouth twice daily as needed for pain - this in an antiinflammatory medicine (NSAID) and is similar to ibuprofen  - many people feel that it is stronger than ibuprofen  and it is easier to take since it is a smaller pill.  Please use this only for 1 week - if your pain persists, you will need to follow up with your doctor in the office for ongoing guidance and pain control.

## 2024-03-17 NOTE — Progress Notes (Signed)
 Orthopedic Tech Progress Note Patient Details:  Vickie Hernandez 28-Jan-1971 [id number]  Ortho Devices Type of Ortho Device: Velcro wrist splint Ortho Device/Splint Location: right Ortho Device/Splint Interventions: Ordered, Application, Adjustment   Post Interventions Patient Tolerated: Well Instructions Provided: Adjustment of device, Care of device  Waylan Thom Loving 03/17/2024, 5:25 PM

## 2024-03-17 NOTE — ED Triage Notes (Signed)
 Pt reports with right hand pain since yesterday, pt states that when she picks something up, there is a shooting pain up to her elbow.

## 2024-03-17 NOTE — ED Provider Notes (Signed)
 [place] [facility]ENC[facility] DEPART[facility] AT [facility] Provider Note   CSN: [id number] Arrival date & time: 03/17/24  1543     Patient presents with: Hand Pain   Vickie Hernandez is a 53 y.o. female.   Hand Pain  Patient is a 53 year old female presenting today for concerns for right palmar pain radiating to right elbow has been present for the last couple of days.  Notes that she has had increased pain when picking up things reporting as a sharp pain.  Notes that it does radiate to her first, second, third finger  Denies numbness, weakness, tingling, rashes, fever, injuries/trauma.     Prior to Admission medications   Medication Sig Start Date End Date Taking? Authorizing Provider  naproxen  (NAPROSYN ) 500 MG tablet Take 1 tablet (500 mg total) by mouth 2 (two) times daily. 03/17/24  Yes Beola Terrall RAMAN, PA-C  dicyclomine  (BENTYL ) 20 MG tablet Take 1 tablet (20 mg total) by mouth 2 (two) times daily. 12/22/20   Odell Balls, PA-C  HYDROcodone -acetaminophen  (NORCO/VICODIN) 5-325 MG tablet Take 2 tablets by mouth every 6 (six) hours as needed for severe pain (pain score 7-10). 02/25/24   Garrick Charleston, MD  ondansetron  (ZOFRAN  ODT) 4 MG disintegrating tablet Take 1 tablet (4 mg total) by mouth every 8 (eight) hours as needed for nausea or vomiting. 12/22/20   Sofia, Leslie K, PA-C  ondansetron  (ZOFRAN -ODT) 4 MG disintegrating tablet Take 1 tablet (4 mg total) by mouth every 8 (eight) hours as needed for nausea or vomiting. 02/25/24   Garrick Charleston, MD  sucralfate  (CARAFATE ) 1 g tablet Take 1 tablet (1 g total) by mouth 4 (four) times daily -  with meals and at bedtime. 12/22/20   Odell Balls, PA-C  albuterol  (PROVENTIL ) 2 MG tablet Take 1 tablet (2 mg total) by mouth 3 (three) times daily. Patient not taking: Reported on 04/24/2019 12/25/11 05/03/20  Douglass Rector, MD  omeprazole  (PRILOSEC) 20 MG capsule Take 1 capsule (20 mg total) by mouth daily. Patient not taking: Reported on  04/24/2017 04/22/15 05/03/20  Mackuen, Courteney Lyn, MD  promethazine  (PHENERGAN ) 25 MG tablet Take 1 tablet (25 mg total) by mouth every 6 (six) hours as needed for nausea or vomiting. Patient not taking: Reported on 04/24/2017 12/15/16 05/03/20  Ward, Josette SAILOR, DO    Allergies: Bee venom, Iodinated contrast media, Other, and Shellfish allergy    Review of Systems  Musculoskeletal:  Positive for arthralgias.  All other systems reviewed and are negative.   Updated Vital Signs BP (!) 125/91 (BP Location: Left Arm)   Pulse 98   Temp 98.9 F (37.2 C) (Oral)   Resp 16   LMP  (Exact Date)   SpO2 100%   Physical Exam Vitals and nursing note reviewed.  Constitutional:      General: She is not in acute distress.    Appearance: Normal appearance. She is not ill-appearing or diaphoretic.  HENT:     Head: Normocephalic and atraumatic.  Eyes:     General: No scleral icterus.       Right eye: No discharge.        Left eye: No discharge.     Extraocular Movements: Extraocular movements intact.     Conjunctiva/sclera: Conjunctivae normal.  Cardiovascular:     Rate and Rhythm: Normal rate and regular rhythm.     Pulses: Normal pulses.     Heart sounds: Normal heart sounds. No murmur heard.    No friction rub. No gallop.  Pulmonary:     Effort: Pulmonary effort is normal. No respiratory distress.     Breath sounds: No stridor. No wheezing, rhonchi or rales.  Chest:     Chest wall: No tenderness.  Abdominal:     General: Abdomen is flat. There is no distension.     Palpations: Abdomen is soft.     Tenderness: There is no abdominal tenderness. There is no right CVA tenderness, left CVA tenderness, guarding or rebound.  Musculoskeletal:        General: No swelling, tenderness, deformity or signs of injury.     Cervical back: Normal range of motion. No rigidity.     Right lower leg: No edema.     Left lower leg: No edema.     Comments: Positive Phalen test, reporting mild discomfort  with Terrilee testing.  No pain or swelling noted around elbow.  Skin:    General: Skin is warm and dry.     Findings: No bruising, erythema or lesion.  Neurological:     General: No focal deficit present.     Mental Status: She is alert and oriented to person, place, and time. Mental status is at baseline.     Sensory: No sensory deficit.     Motor: No weakness.  Psychiatric:        Mood and Affect: Mood normal.     (all labs ordered are listed, but only abnormal results are displayed) Labs Reviewed - No data to display  EKG: None  Radiology: No results found.  Procedures   Medications Ordered in the ED - No data to display                               Medical Decision Making  This patient is a 53 year old female who presents to the ED for concern of right wrist pain as shooting, sharp that radiates to the first, second, third fingers.  Present when picking objects up.  Notes to be right-hand-dominant.  Positive Phalen's test, noting some discomfort with Terrilee testing.  Suggestive of carpal tunnel syndrome.  Applied wrist brace to right wrist and told to wear only at night.  Told to follow-up with PCP.  Prescribed naproxen  for pain.  Low suspicion for septic arthritis, infectious etiologies at this time.  No injuries suggesting fracture.  Patient vital signs have remained stable throughout the course of patient's time in the ED. Low suspicion for any other emergent pathology at this time. I believe this patient is safe to be discharged. Provided strict return to ER precautions. Patient expressed agreement and understanding of plan. All questions were answered.  Differential diagnoses prior to evaluation: The emergent differential diagnosis includes, but is not limited to, carpal tunnel syndrome, cubital tunnel syndrome, lateral epicondylitis, medial epicondylitis, septic arthritis, gout, arthritis, tendinitis. This is not an exhaustive differential.   Past  Medical History / Co-morbidities / Social History: Anemia, asthma, chronic back pain  Additional history: Chart reviewed. Pertinent results include:   Last seen in the emergency department on 02/25/24 for flank pain secondary to passed kidney stone versus calcified fibroid.  Lab Tests/Imaging studies:  Considered x-ray testing but do not believe is warranted at this time.   Medications: I ordered medication including naproxen .  I have reviewed the patients home medicines and have made adjustments as needed.  Critical Interventions: None  Social Determinants of Health: Has good follow-up with PCP  Disposition: After consideration of the  diagnostic results and the patients response to treatment, I feel that the patient would benefit from discharge and treatment as above.   emergency department workup does not suggest an emergent condition requiring admission or immediate intervention beyond what has been performed at this time. The plan is: Symptomatic management home, follow-up with PCP. The patient is safe for discharge and has been instructed to return immediately for worsening symptoms, change in symptoms or any other concerns.     Final diagnoses:  Right hand pain    ED Discharge Orders          Ordered    naproxen  (NAPROSYN ) 500 MG tablet  2 times daily        03/17/24 1718               Beola Terrall RAMAN, [place] 03/17/24 1719    Mannie Pac T, DO 03/18/24 2226

## 2024-04-26 ENCOUNTER — Emergency Department (HOSPITAL_COMMUNITY)
Admission: EM | Admit: 2024-04-26 | Discharge: 2024-04-26 | Disposition: A | Discharge status: Home / Self Care | Attending: Emergency Medicine | Admitting: Emergency Medicine

## 2024-04-26 ENCOUNTER — Emergency Department (HOSPITAL_COMMUNITY)

## 2024-04-26 DIAGNOSIS — R197 Diarrhea, unspecified: Secondary | ICD-10-CM | POA: Diagnosis not present

## 2024-04-26 DIAGNOSIS — R1084 Generalized abdominal pain: Secondary | ICD-10-CM | POA: Insufficient documentation

## 2024-04-26 DIAGNOSIS — R112 Nausea with vomiting, unspecified: Secondary | ICD-10-CM | POA: Insufficient documentation

## 2024-04-26 DIAGNOSIS — J45909 Unspecified asthma, uncomplicated: Secondary | ICD-10-CM | POA: Insufficient documentation

## 2024-04-26 LAB — CBC
HCT: 39.5 % (ref 36.0–46.0)
Hemoglobin: 13 g/dL (ref 12.0–15.0)
MCH: 31 pg (ref 26.0–34.0)
MCHC: 32.9 g/dL (ref 30.0–36.0)
MCV: 94 fL (ref 80.0–100.0)
Platelets: 231 K/uL (ref 150–400)
RBC: 4.2 MIL/uL (ref 3.87–5.11)
RDW: 12.6 % (ref 11.5–15.5)
WBC: 6.6 K/uL (ref 4.0–10.5)
nRBC: 0 % (ref 0.0–0.2)

## 2024-04-26 LAB — COMPREHENSIVE METABOLIC PANEL WITH GFR
ALT: 17 U/L (ref 0–44)
AST: 21 U/L (ref 15–41)
Albumin: 5 g/dL (ref 3.5–5.0)
Alkaline Phosphatase: 103 U/L (ref 38–126)
Anion gap: 16 — ABNORMAL HIGH (ref 5–15)
BUN: 18 mg/dL (ref 6–20)
CO2: 22 mmol/L (ref 22–32)
Calcium: 10.2 mg/dL (ref 8.9–10.3)
Chloride: 105 mmol/L (ref 98–111)
Creatinine, Ser: 0.71 mg/dL (ref 0.44–1.00)
GFR, Estimated: >60 mL/min (ref >60)
Glucose, Bld: 111 mg/dL — ABNORMAL HIGH (ref 70–99)
Potassium: 3.4 mmol/L — ABNORMAL LOW (ref 3.5–5.1)
Sodium: 143 mmol/L (ref 135–145)
Total Bilirubin: 0.8 mg/dL (ref 0.0–1.2)
Total Protein: 9 g/dL — ABNORMAL HIGH (ref 6.5–8.1)

## 2024-04-26 LAB — URINALYSIS, ROUTINE W REFLEX MICROSCOPIC
Bilirubin Urine: NEGATIVE
Glucose, UA: NEGATIVE mg/dL
Ketones, ur: 20 mg/dL — AB
Leukocytes,Ua: NEGATIVE
Nitrite: NEGATIVE
Protein, ur: 100 mg/dL — AB
Specific Gravity, Urine: 1.031 — ABNORMAL HIGH (ref 1.005–1.030)
pH: 5 (ref 5.0–8.0)

## 2024-04-26 LAB — LIPASE, BLOOD: Lipase: 58 U/L — ABNORMAL HIGH (ref 11–51)

## 2024-04-26 LAB — RESP PANEL BY RT-PCR (RSV, FLU A&B, COVID)  RVPGX2
Influenza A by PCR: NEGATIVE
Influenza B by PCR: NEGATIVE
Resp Syncytial Virus by PCR: NEGATIVE
SARS Coronavirus 2 by RT PCR: NEGATIVE

## 2024-04-26 MED ORDER — LACTATED RINGERS IV BOLUS
1000.0000 mL | Freq: Once | INTRAVENOUS | Status: AC
  Administered 2024-04-26: 1000 mL via INTRAVENOUS

## 2024-04-26 MED ORDER — LIDOCAINE VISCOUS HCL 2 % MT SOLN
15.0000 mL | Freq: Once | OROMUCOSAL | Status: AC
  Administered 2024-04-26: 15 mL via ORAL
  Filled 2024-04-26: qty 15

## 2024-04-26 MED ORDER — POTASSIUM CHLORIDE CRYS ER 20 MEQ PO TBCR
40.0000 meq | EXTENDED_RELEASE_TABLET | Freq: Once | ORAL | Status: AC
  Administered 2024-04-26: 40 meq via ORAL
  Filled 2024-04-26: qty 2

## 2024-04-26 MED ORDER — ONDANSETRON 4 MG PO TBDP: 4.0000 mg | ORAL_TABLET | Freq: Three times a day (TID) | ORAL | 0 refills | Status: AC | PRN

## 2024-04-26 MED ORDER — ONDANSETRON HCL 4 MG/2ML IJ SOLN
4.0000 mg | Freq: Once | INTRAMUSCULAR | Status: AC
  Administered 2024-04-26: 4 mg via INTRAVENOUS
  Filled 2024-04-26: qty 2

## 2024-04-26 MED ORDER — ALUM & MAG HYDROXIDE-SIMETH 200-200-20 MG/5ML PO SUSP
30.0000 mL | Freq: Once | ORAL | Status: AC
  Administered 2024-04-26: 30 mL via ORAL
  Filled 2024-04-26: qty 30

## 2024-04-26 NOTE — ED Notes (Signed)
 The patient's IV infiltrated and she did not get any of her IV fluids infused. Dr. Ula informed.

## 2024-04-26 NOTE — ED Provider Notes (Signed)
 [facility] AT [facility] Provider Note   CSN: [id number] Arrival date & time: 04/26/24  1145     History Chief Complaint  Patient presents with   Abdominal Pain    HPI: Vickie Hernandez is a 53 y.o. female with history pertinent for asthma, contrast allergy who presents complaining of nausea, vomiting, diarrhea, abdominal pain. Patient arrived via POV.  History provided by patient.  No interpreter required during this encounter.  Patient reports that she was in her normal state of health until yesterday when she developed generalized abdominal pain, nausea, vomiting, and loose stools.  Reports that she has not had any stools today which she attributes to poor oral intake.  Endorses chills, denies fever, denies chest pain, shortness of breath, dysuria, hematuria, vaginal bleeding, vaginal discharge.  Reports that she has an IUD for birth control.  Patient's recorded medical, surgical, social, medication list and allergies were reviewed in the Snapshot window as part of the initial history.   Prior to Admission medications   Medication Sig Start Date End Date Taking? Authorizing Provider  dicyclomine  (BENTYL ) 20 MG tablet Take 1 tablet (20 mg total) by mouth 2 (two) times daily. 12/22/20   Odell Balls, PA-C  HYDROcodone -acetaminophen  (NORCO/VICODIN) 5-325 MG tablet Take 2 tablets by mouth every 6 (six) hours as needed for severe pain (pain score 7-10). 02/25/24   Garrick Charleston, MD  naproxen  (NAPROSYN ) 500 MG tablet Take 1 tablet (500 mg total) by mouth 2 (two) times daily. 03/17/24   Bauer, Collin S, PA-C  ondansetron  (ZOFRAN  ODT) 4 MG disintegrating tablet Take 1 tablet (4 mg total) by mouth every 8 (eight) hours as needed for nausea or vomiting. 12/22/20   Sofia, Leslie K, PA-C  ondansetron  (ZOFRAN -ODT) 4 MG disintegrating tablet Take 1 tablet (4 mg total) by mouth every 8 (eight) hours as needed for nausea or vomiting. 02/25/24   Garrick Charleston, MD   sucralfate  (CARAFATE ) 1 g tablet Take 1 tablet (1 g total) by mouth 4 (four) times daily -  with meals and at bedtime. 12/22/20   Odell Balls, PA-C  albuterol  (PROVENTIL ) 2 MG tablet Take 1 tablet (2 mg total) by mouth 3 (three) times daily. Patient not taking: Reported on 04/24/2019 12/25/11 05/03/20  Douglass Rector, MD  omeprazole  (PRILOSEC) 20 MG capsule Take 1 capsule (20 mg total) by mouth daily. Patient not taking: Reported on 04/24/2017 04/22/15 05/03/20  Mackuen, Jackye Saha, MD  promethazine  (PHENERGAN ) 25 MG tablet Take 1 tablet (25 mg total) by mouth every 6 (six) hours as needed for nausea or vomiting. Patient not taking: Reported on 04/24/2017 12/15/16 05/03/20  Ward, Josette SAILOR, DO     Allergies: Bee venom, Iodinated contrast media, Other, and Shellfish allergy   Review of Systems   ROS as per HPI  Physical Exam Updated Vital Signs BP (!) 128/97   Pulse 85   Temp 98.1 F (36.7 C)   Resp 16   Ht 5' 5 (1.651 m)   Wt 78.5 kg   SpO2 99%   BMI 28.79 kg/m  Physical Exam Vitals and nursing note reviewed.  Constitutional:      General: She is not in acute distress.    Appearance: She is well-developed.  HENT:     Head: Normocephalic and atraumatic.  Eyes:     Conjunctiva/sclera: Conjunctivae normal.  Cardiovascular:     Rate and Rhythm: Normal rate and regular rhythm.     Heart sounds: No murmur heard. Pulmonary:  Effort: Pulmonary effort is normal. No respiratory distress.     Breath sounds: Normal breath sounds.  Abdominal:     Palpations: Abdomen is soft.     Tenderness: There is generalized abdominal tenderness (Most prominent in the suprapubic area as well as the epigastrium).  Musculoskeletal:        General: No swelling.     Cervical back: Neck supple.  Skin:    General: Skin is warm and dry.     Capillary Refill: Capillary refill takes less than 2 seconds.  Neurological:     Mental Status: She is alert.  Psychiatric:        Mood and Affect: Mood  normal.     ED Course/ Medical Decision Making/ A&P    Procedures Procedures   Medications Ordered in ED Medications  ondansetron  (ZOFRAN ) injection 4 mg (4 mg Intravenous Given 04/26/24 1540)  lactated ringers bolus 1,000 mL (1,000 mLs Intravenous New Bag/Given 04/26/24 1548)  potassium chloride SA (KLOR-CON M) CR tablet 40 mEq (40 mEq Oral Given 04/26/24 1546)  alum & mag hydroxide-simeth (MAALOX/MYLANTA) 200-200-20 MG/5ML suspension 30 mL (30 mLs Oral Given 04/26/24 1543)    And  lidocaine  (XYLOCAINE ) 2 % viscous mouth solution 15 mL (15 mLs Oral Given 04/26/24 1543)    Medical Decision Making:   Vickie Hernandez is a 53 y.o. female who presents for multiple symptoms as per above.  Physical exam is pertinent for generalized tenderness to palpation, most prominent in the epigastrium as well as suprapubic area.   The differential includes but is not limited to gastritis, gastroenteritis, viral syndrome, pancreatitis, dehydration, AKI, electrolyte derangement.  Independent historian: None  External data reviewed: No pertinent external data  Initial Plan:  Screening labs including CBC and Metabolic panel to evaluate for infectious or metabolic etiology of disease.  Lipase to evaluate for pancreatitis COVID/flu/RSV to evaluate for viral illness Urinalysis with reflex culture ordered to evaluate for UTI or relevant urologic/nephrologic pathology.  CT abdomen pelvis to evaluate for structural/infectious intra-abdominal pathology.  Objective evaluation as below reviewed   Labs: Ordered, Independent interpretation, and Details: COVID/flu/RSV negative.  Lipase mildly elevated at 58.  CBC without leukocytosis, anemia, thrombocytopenia.  CMP without AKI, mild hypokalemia without emergent electrolyte derangement, no LFT abnormality, mild anion gap elevation consistent with mild dehydration  Radiology: Ordered, Independent interpretation, Details: Reviewed CT of the abdomen pelvis, do not  appreciate intra-abdominal free air, free fluid, significant fat stranding, obstructive bowel gas pattern, and All images reviewed independently.  Agree with radiology report at this time.   CT ABDOMEN PELVIS WO CONTRAST Result Date: 04/26/2024 EXAM: CT ABDOMEN AND PELVIS WITHOUT CONTRAST 04/26/2024 02:49:17 PM TECHNIQUE: CT of the abdomen and pelvis was performed without the administration of intravenous contrast. Multiplanar reformatted images are provided for review. Automated exposure control, iterative reconstruction, and/or weight-based adjustment of the mA/kV was utilized to reduce the radiation dose to as low as reasonably achievable. COMPARISON: 02/25/2024 CLINICAL HISTORY: Abdominal pain, acute, nonlocalized. FINDINGS: LOWER CHEST: No acute abnormality. LIVER: The liver is unremarkable. GALLBLADDER AND BILE DUCTS: Gallbladder is unremarkable. No biliary ductal dilatation. SPLEEN: No acute abnormality. PANCREAS: No acute abnormality. ADRENAL GLANDS: No acute abnormality. KIDNEYS, URETERS AND BLADDER: No stones in the kidneys or ureters. No hydronephrosis. No perinephric or periureteral stranding. Urinary bladder is unremarkable. GI AND BOWEL: Stomach demonstrates no acute abnormality. There is no bowel obstruction. PERITONEUM AND RETROPERITONEUM: No ascites. No free air. VASCULATURE: Aorta is normal in caliber. LYMPH NODES: No lymphadenopathy.  REPRODUCTIVE ORGANS: Intrauterine device is noted. Calcified uterine fibroid is noted. BONES AND SOFT TISSUES: No acute osseous abnormality. No focal soft tissue abnormality. IMPRESSION: 1. No acute findings in the abdomen or pelvis. 2. Intrauterine device in appropriate position. 3. Calcified uterine fibroid. Electronically signed by: Lynwood Seip MD 04/26/2024 02:56 PM EST RP Workstation: [id number]    EKG/Medicine tests: Not indicated EKG Interpretation:    Interventions: LR, potassium repletion, Maalox/lidocaine , Zofran   See the EMR for full details  regarding lab and imaging results.  Patient presents to the emergency department for multiple symptoms.  Patient has nausea, vomiting, diarrhea, chills which is potentially consistent with gastroenteritis, however given patient has generalized abdominal pain that is most prominent in the epigastrium as well as suprapubic area, and given she reports p.o. intolerance at home, do feel that patient warrants labs, imaging, as well as symptomatic treatment.  Will order Zofran , LR bolus for nausea and dehydration.  Given patient has epigastric tenderness to palpation, will order Maalox/lidocaine  for possible component of underlying gastritis.  Labs obtained, patient with mild elevation of lipase, however no pancreatic inflammation on CT to suggest acute pancreatitis, additionally diarrhea is less consistent with pancreatitis.  Labs otherwise reveal mild dehydration, mild hypokalemia potentially due to volume losses, repleted with oral potassium.  Patient underwent CT of the abdomen and pelvis which does not reveal acute intra-abdominal pathology, noncontrasted scan was obtained given patient reports history of contrast allergy (hives).  Patient is awaiting urine test, has yet to produce specimen, no significant AKI or electrolyte derangement, however given patient does have suprapubic tenderness to palpation, do feel that patient warrants UA to evaluate for underlying UTI.  Plan at the time of handoff, follow-up UA, follow-up p.o. tolerance after symptomatic treatment, if UA reassuring and patient able to tolerate p.o., consider discharge with likely etiology of gastroenteritis.  Presentation is most consistent with acute complicated illness  Discussion of management or test interpretations with external provider(s): None by the time of handoff  Risk Drugs:Prescription drug management Treatment: Pending at the time of handoff  Disposition: HANDOFF: At the time of signout, the patients UA had not yet been  completed. I transferred care of the patient at the time of signout to Dr. Ula. I informed the incoming care provider of the patient's history, status, and management plan. I addressed all of their concerns and/or questions to the best of my ability. Please refer to the incoming care provider's note for details regarding the remainder of the patient's ED course and disposition.  MDM generated using voice dictation software and may contain dictation errors.  Please contact me for any clarification or with any questions.  Clinical Impression:  1. Nausea vomiting and diarrhea      Data Unavailable   Final Clinical Impression(s) / ED Diagnoses Final diagnoses:  Nausea vomiting and diarrhea    Rx / DC Orders ED Discharge Orders     None        Rogelia Jerilynn RAMAN, MD 04/26/24 [phone]

## 2024-04-26 NOTE — ED Notes (Signed)
 The patient ambulated to the restroom and her IV became dislodged.

## 2024-04-26 NOTE — ED Notes (Addendum)
 Vickie Hernandez,  IV team RN had come to the ER after the order was placed for IV team consult but she did not start an IV because the chart looks like she got everything. I informed her that the IV fluids that were ordered was paused but she needs her IV fluids. Dr. Ula was informed but then placed the patient up for discharge. This RN informed Dr. Ula that the patient did not receive any of her IV fluids and that she was requesting IV pain meds. He stated he would go in to speak with her. I informed the patient that the doctor said he would be in to speak with her but she stated she would just leave and go somewhere else. This RN apologized to her and encouraged to speak with the doctor. The patient stated she was unhappy with her care because she came here for dehydration and did not get to receive her IV fluids because of her IV infiltration. The patient verbalized understanding of her d/c instructions, prescription and follow up care. She was A&Ox4, ambulatory at d/c with independent steady gait.

## 2024-04-26 NOTE — ED Provider Notes (Signed)
 I was asked to follow-up on the patient's urinalysis for discharge.  The patient was evaluated here for symptoms consistent with gastroenteritis but was having some urinary symptoms.  Urinalysis is reviewed and appears to be a slightly contaminated sample but not convincing for infection.  I think patient stable for discharge.  Physical Exam  BP (!) 128/97   Pulse 85   Temp 98.1 F (36.7 C)   Resp 16   Ht 5' 5 (1.651 m)   Wt 78.5 kg   SpO2 99%   BMI 28.79 kg/m   Physical Exam Gen: NAD  Procedures  Procedures  ED Course / MDM    Medical Decision Making Amount and/or Complexity of Data Reviewed Labs: ordered. Radiology: ordered.  Risk OTC drugs. Prescription drug management.          Vickie Prentice SAUNDERS, MD 04/26/24 [phone]

## 2024-04-26 NOTE — ED Triage Notes (Signed)
 Patient c/o nausea vomiting abd pain  Starting yesterday Reports sweats, chills Unsure if fever at home Pain rated 7/10 Denies coughing, congestion or any sick contacts

## 2024-04-26 NOTE — Discharge Instructions (Addendum)
 Your workup today was reassuring.  Please take the Zofran  as needed for nausea and follow-up with your doctor.  Return to the ER for worsening symptoms.

## 2024-04-26 NOTE — ED Notes (Signed)
 Unable to obtain IV access,  Ultrasound IV requested
